# Patient Record
Sex: Male | Born: 1945 | Race: White | Hispanic: No | Marital: Married | Smoking: Never smoker
Health system: Southern US, Community
[De-identification: ages and names within clinical notes are randomized; demographics above are authoritative.]

## PROBLEM LIST (undated history)

## (undated) DIAGNOSIS — K760 Fatty (change of) liver, not elsewhere classified: Secondary | ICD-10-CM

## (undated) DIAGNOSIS — M1712 Unilateral primary osteoarthritis, left knee: Secondary | ICD-10-CM

## (undated) DIAGNOSIS — K649 Unspecified hemorrhoids: Secondary | ICD-10-CM

## (undated) DIAGNOSIS — I10 Essential (primary) hypertension: Secondary | ICD-10-CM

## (undated) DIAGNOSIS — K5790 Diverticulosis of intestine, part unspecified, without perforation or abscess without bleeding: Secondary | ICD-10-CM

## (undated) DIAGNOSIS — K219 Gastro-esophageal reflux disease without esophagitis: Secondary | ICD-10-CM

## (undated) DIAGNOSIS — K8020 Calculus of gallbladder without cholecystitis without obstruction: Secondary | ICD-10-CM

## (undated) DIAGNOSIS — I5189 Other ill-defined heart diseases: Secondary | ICD-10-CM

## (undated) DIAGNOSIS — Z8673 Personal history of transient ischemic attack (TIA), and cerebral infarction without residual deficits: Secondary | ICD-10-CM

## (undated) DIAGNOSIS — E785 Hyperlipidemia, unspecified: Secondary | ICD-10-CM

## (undated) DIAGNOSIS — Z87898 Personal history of other specified conditions: Secondary | ICD-10-CM

## (undated) DIAGNOSIS — Z860101 Personal history of adenomatous and serrated colon polyps: Secondary | ICD-10-CM

## (undated) DIAGNOSIS — K449 Diaphragmatic hernia without obstruction or gangrene: Secondary | ICD-10-CM

## (undated) HISTORY — DX: Gastro-esophageal reflux disease without esophagitis: K21.9

## (undated) HISTORY — PX: COLONOSCOPY: SHX174

## (undated) HISTORY — DX: Personal history of adenomatous and serrated colon polyps: Z86.0101

## (undated) HISTORY — DX: Other ill-defined heart diseases: I51.89

## (undated) HISTORY — DX: Hyperlipidemia, unspecified: E78.5

## (undated) HISTORY — DX: Unspecified hemorrhoids: K64.9

## (undated) HISTORY — DX: Diaphragmatic hernia without obstruction or gangrene: K44.9

## (undated) HISTORY — DX: Calculus of gallbladder without cholecystitis without obstruction: K80.20

## (undated) HISTORY — DX: Diverticulosis of intestine, part unspecified, without perforation or abscess without bleeding: K57.90

## (undated) HISTORY — PX: FRACTURE SURGERY: SHX138

## (undated) HISTORY — PX: BREAST SURGERY: SHX581

## (undated) HISTORY — DX: Personal history of transient ischemic attack (TIA), and cerebral infarction without residual deficits: Z86.73

## (undated) HISTORY — DX: Unilateral primary osteoarthritis, left knee: M17.12

## (undated) HISTORY — DX: Fatty (change of) liver, not elsewhere classified: K76.0

## (undated) HISTORY — DX: Essential (primary) hypertension: I10

## (undated) HISTORY — DX: Personal history of other specified conditions: Z87.898

---

## 2012-02-20 HISTORY — PX: APPENDECTOMY: SHX54

## 2012-11-05 ENCOUNTER — Ambulatory Visit (INDEPENDENT_AMBULATORY_CARE_PROVIDER_SITE_OTHER): Payer: Medicare Other | Admitting: Cardiovascular Disease

## 2012-11-05 ENCOUNTER — Encounter: Payer: Self-pay | Admitting: Cardiovascular Disease

## 2012-11-05 VITALS — BP 140/90 | HR 60 | Ht 68.0 in | Wt 182.0 lb

## 2012-11-05 DIAGNOSIS — R079 Chest pain, unspecified: Secondary | ICD-10-CM

## 2012-11-05 NOTE — Patient Instructions (Addendum)
Your physician has requested that you have an exercise stress myoview. For further information please visit [email or web address] Please follow instruction sheet, as given.  Your physician recommends that you continue on your current medications as directed. Please refer to the Current Medication list given to you today.  Your physician recommends that you schedule a follow-up appointment as needed.

## 2012-11-11 ENCOUNTER — Encounter: Payer: Self-pay | Admitting: Cardiovascular Disease

## 2012-11-11 DIAGNOSIS — R079 Chest pain, unspecified: Secondary | ICD-10-CM | POA: Insufficient documentation

## 2012-11-11 NOTE — Progress Notes (Signed)
HPI:  This is a 67 year old gentleman presenting for initial cardiac evaluation. The patient has no personal history of heart disease. Over the last 3-4 weeks, he has developed tightness and burning in his chest. This occurs at rest and with bending forward. There is associated nausea and exertional dyspnea. However, he is able to walk 2 miles in 30 minutes. He denies chest pain or pressure with that level of exertion. The patient has had acid reflux in the past, but his current symptoms feel different from that. He has started taking omeprazole without much improvement. He denies palpitations, lightheadedness, orthopnea, PND, or edema. The patient's chest pain is nonradiating and there are no other associated symptoms.  His cardiac risk factors include only hypertension. Other past medical problems include gastroesophageal reflux disease.  The patient is retired from [employer]. He is a lifelong nonsmoker and does not drink alcohol.  His family history is pertinent for longevity. His father died at age 66 of congestive heart failure. His mother is still alive at age 66. His father did have coronary bypass surgery in his 66s. He has a brother with hyperlipidemia and a sister with hypertension.  Outpatient Encounter Prescriptions as of 11/05/2012  Medication Sig Dispense Refill  . Inulin (METAMUCIL CLEAR & NATURAL) POWD Take 1 scoop by mouth daily. Take one scoop daily      . KRILL OIL PO Take by mouth daily.      Marland Kitchen lisinopril-hydrochlorothiazide (PRINZIDE,ZESTORETIC) 10-12.5 MG per tablet Take 1 tablet by mouth daily.      . Omeprazole 20 MG TBEC Take 20 mg by mouth daily.      . [DISCONTINUED] l-methylfolate-B6-B12 (METANX) 3-35-2 MG TABS Take 1 tablet by mouth daily.       No facility-administered encounter medications on file as of 11/05/2012.    Tape  Past Medical History  Diagnosis Date  . Hypertension   . Hemorrhoids   . Gastroesophageal reflux disease     Past Surgical  History  Procedure Laterality Date  . Appendectomy  2014    History   Social History  . Marital Status: Married    Spouse Name: N/A    Number of Children: N/A  . Years of Education: N/A   Occupational History  . Not on file.   Social History Main Topics  . Smoking status: Never Smoker   . Smokeless tobacco: Not on file  . Alcohol Use: Not on file  . Drug Use: Not on file  . Sexual Activity: Not on file   Other Topics Concern  . Not on file   Social History Narrative   The patient is retired. He is married. He does not smoke cigarettes or drink alcohol.   ROS:  General: no fevers/chills/night sweats Eyes: no blurry vision, diplopia, or amaurosis ENT: no sore throat or hearing loss Resp: no cough, wheezing, or hemoptysis CV: no edema or palpitations GI: no abdominal pain, nausea, vomiting, diarrhea, or constipation GU: no dysuria, frequency, or hematuria Skin: no rash Neuro: no headache, numbness, tingling, or weakness of extremities Musculoskeletal: no joint pain or swelling Heme: no bleeding, DVT, or easy bruising Endo: no polydipsia or polyuria  BP 140/90  Pulse 60  Ht 5\' 8"  (1.727 m)  Wt 182 lb (82.555 kg)  BMI 27.68 kg/m2  PHYSICAL EXAM: Pt is alert and oriented, WD, WN, in no distress. HEENT: normal Neck: JVP normal. Carotid upstrokes normal without bruits. No thyromegaly. Lungs: equal expansion, clear bilaterally CV: Apex is discrete  and nondisplaced, RRR without murmur or gallop Abd: soft, NT, +BS, no bruit, no hepatosplenomegaly Back: no CVA tenderness Ext: no C/C/E        DP/PT pulses intact and = Skin: warm and dry without rash Neuro: CNII-XII intact             Strength intact = bilaterally  EKG:  Normal sinus rhythm 60 beats per minute, within normal limits.  ASSESSMENT AND PLAN: This is a 67 year old gentleman with chest pain. The patient's chest pain syndrome has both typical and atypical features. Background cardiac risk factors include  hypertension. His father did have coronary bypass surgery in his 66s. I have recommended an exercise stress Myoview for further ischemic evaluation. If this study is negative, would recommend further evaluation for GI etiologies of chest discomfort such as acid reflux. Plans for evaluation were discussed with the patient who is agreeable to proceed. further workup as indicated pending his stress test results.  For his hypertension, he will continue on lisinopril and hydrochlorothiazide. He reports home blood pressure readings have been in a good range.  As long as his stress test is low risk, I will plan on seeing him back as needed.  Tonny Bollman 11/11/2012 6:09 AM

## 2012-11-18 ENCOUNTER — Ambulatory Visit (HOSPITAL_COMMUNITY): Payer: Medicare Other | Attending: Cardiovascular Disease | Admitting: Radiology

## 2012-11-18 VITALS — BP 165/90 | HR 79 | Ht 68.0 in | Wt 179.0 lb

## 2012-11-18 DIAGNOSIS — R0602 Shortness of breath: Secondary | ICD-10-CM

## 2012-11-18 DIAGNOSIS — R0609 Other forms of dyspnea: Secondary | ICD-10-CM | POA: Insufficient documentation

## 2012-11-18 DIAGNOSIS — I10 Essential (primary) hypertension: Secondary | ICD-10-CM | POA: Insufficient documentation

## 2012-11-18 DIAGNOSIS — I4949 Other premature depolarization: Secondary | ICD-10-CM

## 2012-11-18 DIAGNOSIS — R079 Chest pain, unspecified: Secondary | ICD-10-CM

## 2012-11-18 DIAGNOSIS — R0789 Other chest pain: Secondary | ICD-10-CM | POA: Insufficient documentation

## 2012-11-18 DIAGNOSIS — R110 Nausea: Secondary | ICD-10-CM | POA: Insufficient documentation

## 2012-11-18 DIAGNOSIS — Z8249 Family history of ischemic heart disease and other diseases of the circulatory system: Secondary | ICD-10-CM | POA: Insufficient documentation

## 2012-11-18 DIAGNOSIS — R0989 Other specified symptoms and signs involving the circulatory and respiratory systems: Secondary | ICD-10-CM | POA: Insufficient documentation

## 2012-11-18 MED ORDER — TECHNETIUM TC 99M SESTAMIBI GENERIC - CARDIOLITE
10.0000 | Freq: Once | INTRAVENOUS | Status: AC | PRN
  Administered 2012-11-18: 10 via INTRAVENOUS

## 2012-11-18 MED ORDER — TECHNETIUM TC 99M SESTAMIBI GENERIC - CARDIOLITE
30.0000 | Freq: Once | INTRAVENOUS | Status: AC | PRN
  Administered 2012-11-18: 30 via INTRAVENOUS

## 2012-11-18 NOTE — Progress Notes (Signed)
[facility] SITE 3 NUCLEAR MED [address] [place], [place] [address] [phone]    Cardiology Nuclear Med Study  Christopher Francis is a 67 y.o. male     MRN : [id number]     DOB: 08-Mar-1945  Procedure Date: 11/18/2012  Nuclear Med Background Indication for Stress Test:  Evaluation for Ischemia History:  ~9 yrs ago [id number] Cardiac Risk Factors: Family History - CAD and Hypertension  Symptoms:  Chest Tightness/Burning (last episode of chest discomfort was about a week ago), DOE and Nausea   Nuclear Pre-Procedure Caffeine/Decaff Intake:  8:00pm NPO After: 8:00pm   Lungs:  Clear. O2 Sat: 98% on room air. IV 0.9% NS with Angio Cath:  20g  IV Site: R Hand  IV Started by:  Cathlyn Parsons, RN  Chest Size (in):  42 Cup Size: n/a  Height: 5\' 8"  (1.727 m)  Weight:  179 lb (81.194 kg)  BMI:  Body mass index is 27.22 kg/(m^2). Tech Comments:  med's taken as directed    Nuclear Med Study 1 or 2 day study: 1 day  Stress Test Type:  Stress  Reading MD: Cassell Clement, MD  Order Authorizing Provider:  Tonny Bollman, MD  Resting Radionuclide: Technetium 49m Sestamibi  Resting Radionuclide Dose: 11.0 mCi   Stress Radionuclide:  Technetium 4m Sestamibi  Stress Radionuclide Dose: 33.0 mCi           Stress Protocol Rest HR: 79 Stress HR: 144  Rest BP: 165/90 Stress BP: 220/120  Exercise Time (min): 4:41 METS: 5.6   Predicted Max HR: 154 bpm % Max HR: 93.51 bpm Rate Pressure Product: 27253   Dose of Adenosine (mg):  n/a Dose of Lexiscan: n/a mg  Dose of Atropine (mg): n/a Dose of Dobutamine: n/a mcg/kg/min (at max HR)  Stress Test Technologist: Smiley Houseman, CMA-N  Nuclear Technologist:  Harlow Asa, CNMT     Rest Procedure:  Myocardial perfusion imaging was performed at rest 45 minutes following the intravenous administration of Technetium 32m Sestamibi.  Rest ECG: NSR - Normal EKG  Stress Procedure:  The patient exercised on the treadmill utilizing the  Bruce Protocol for 4:41 minutes. The patient stopped due to fatigue and hypertensive response.  He denied any chest pain.  There were occasional PVC's noted with exercise.  Technetium 38m Sestamibi was injected at peak exercise and myocardial perfusion imaging was performed after a brief delay.  Stress ECG: Insignificant upsloping ST segment depression.  QPS Raw Data Images:  Normal; no motion artifact; normal heart/lung ratio. Stress Images:  Normal homogeneous uptake in all areas of the myocardium. Rest Images:  Normal homogeneous uptake in all areas of the myocardium. Subtraction (SDS):  No evidence of ischemia. Transient Ischemic Dilatation (Normal <1.22):  n/a Lung/Heart Ratio (Normal <0.45):  0.38  Quantitative Gated Spect Images QGS EDV:  92 ml QGS ESV:  32 ml  Impression Exercise Capacity:  Fair exercise capacity. BP Response:  Hypertensive blood pressure response. Clinical Symptoms:  There is dyspnea. ECG Impression:  Insignificant upsloping ST segment depression. Comparison with Prior Nuclear Study: No images to compare  Overall Impression:  Normal stress nuclear study.  LV Ejection Fraction: 65%.  LV Wall Motion:  NL LV Function; NL Wall Motion  [employer]  .

## 2015-03-24 ENCOUNTER — Ambulatory Visit (INDEPENDENT_AMBULATORY_CARE_PROVIDER_SITE_OTHER): Payer: Medicare Other | Admitting: Cardiovascular Disease

## 2015-03-24 ENCOUNTER — Encounter: Payer: Self-pay | Admitting: Cardiovascular Disease

## 2015-03-24 VITALS — BP 140/100 | HR 72 | Ht 68.0 in | Wt 186.0 lb

## 2015-03-24 DIAGNOSIS — R55 Syncope and collapse: Secondary | ICD-10-CM

## 2015-03-24 LAB — CBC
HEMATOCRIT: 43.8 % (ref 39.0–52.0)
HEMOGLOBIN: 14.6 g/dL (ref 13.0–17.0)
MCH: 27.7 pg (ref 26.0–34.0)
MCHC: 33.3 g/dL (ref 30.0–36.0)
MCV: 83 fL (ref 78.0–100.0)
MPV: 9.7 fL (ref 8.6–12.4)
Platelets: 241 10*3/uL (ref 150–400)
RBC: 5.28 MIL/uL (ref 4.22–5.81)
RDW: 14 % (ref 11.5–15.5)
WBC: 6.4 10*3/uL (ref 4.0–10.5)
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
## 2015-03-24 NOTE — Patient Instructions (Signed)
Medication Instructions:  Your physician recommends that you continue on your current medications as directed. Please refer to the Current Medication list given to you today.  Labwork: Your physician recommends that you have lab work today: CBC and CMP  Testing/Procedures: Your physician has requested that you have an echocardiogram. Echocardiography is a painless test that uses sound waves to create images of your heart. It provides your doctor with information about the size and shape of your heart and how well your heart's chambers and valves are working. This procedure takes approximately one hour. There are no restrictions for this procedure.  Your physician has recommended that you wear an event monitor (21 day). Event monitors are medical devices that record the heart's electrical activity. Doctors most often [place] these monitors to diagnose arrhythmias. Arrhythmias are problems with the speed or rhythm of the heartbeat. The monitor is a small, portable device. You can wear one while you do your normal daily activities. This is usually used to diagnose what is causing palpitations/syncope (passing out).  Follow-Up: Your physician wants you to follow-up in: 6 MONTHS with Dr Burt Knack.  You will receive a reminder letter in the mail two months in advance. If you don't receive a letter, please call our office to schedule the follow-up appointment.   Any Other Special Instructions Will Be Listed Below (If Applicable).     If you need a refill on your cardiac medications before your next appointment, please call your pharmacy.

## 2015-03-25 ENCOUNTER — Other Ambulatory Visit: Payer: Self-pay

## 2015-03-25 DIAGNOSIS — R55 Syncope and collapse: Secondary | ICD-10-CM

## 2015-03-25 LAB — COMPREHENSIVE METABOLIC PANEL
ALBUMIN: 4.4 g/dL (ref 3.6–5.1)
ALT: 19 U/L (ref 9–46)
AST: 29 U/L (ref 10–35)
Alkaline Phosphatase: 53 U/L (ref 40–115)
BILIRUBIN TOTAL: 0.7 mg/dL (ref 0.2–1.2)
BUN: 34 mg/dL — ABNORMAL HIGH (ref 7–25)
CHLORIDE: 98 mmol/L (ref 98–110)
CO2: 23 mmol/L (ref 20–31)
CREATININE: 1.27 mg/dL — AB (ref 0.70–1.25)
Calcium: 9.5 mg/dL (ref 8.6–10.3)
Glucose, Bld: 56 mg/dL — ABNORMAL LOW (ref 65–99)
Potassium: 4.8 mmol/L (ref 3.5–5.3)
SODIUM: 135 mmol/L (ref 135–146)
TOTAL PROTEIN: 7.1 g/dL (ref 6.1–8.1)

## 2015-03-28 ENCOUNTER — Ambulatory Visit (HOSPITAL_COMMUNITY): Payer: Medicare Other | Attending: Cardiovascular Disease

## 2015-03-28 ENCOUNTER — Other Ambulatory Visit: Payer: Self-pay

## 2015-03-28 DIAGNOSIS — I10 Essential (primary) hypertension: Secondary | ICD-10-CM | POA: Diagnosis not present

## 2015-03-28 DIAGNOSIS — R55 Syncope and collapse: Secondary | ICD-10-CM | POA: Insufficient documentation

## 2015-03-28 DIAGNOSIS — I517 Cardiomegaly: Secondary | ICD-10-CM | POA: Insufficient documentation

## 2015-03-29 ENCOUNTER — Ambulatory Visit (INDEPENDENT_AMBULATORY_CARE_PROVIDER_SITE_OTHER): Payer: Medicare Other

## 2015-03-29 DIAGNOSIS — R55 Syncope and collapse: Secondary | ICD-10-CM | POA: Diagnosis not present

## 2015-04-29 ENCOUNTER — Encounter: Payer: Self-pay | Admitting: Cardiovascular Disease

## 2015-04-29 NOTE — Telephone Encounter (Signed)
This encounter was created in error - please disregard.

## 2015-04-29 NOTE — Telephone Encounter (Signed)
Returning your call. °

## 2015-05-09 ENCOUNTER — Other Ambulatory Visit (INDEPENDENT_AMBULATORY_CARE_PROVIDER_SITE_OTHER): Payer: Medicare Other | Admitting: *Deleted

## 2015-05-09 DIAGNOSIS — R55 Syncope and collapse: Secondary | ICD-10-CM

## 2015-05-09 LAB — BASIC METABOLIC PANEL
BUN: 20 mg/dL (ref 7–25)
CALCIUM: 9 mg/dL (ref 8.6–10.3)
CHLORIDE: 103 mmol/L (ref 98–110)
CO2: 26 mmol/L (ref 20–31)
Creat: 1.13 mg/dL (ref 0.70–1.25)
Glucose, Bld: 98 mg/dL (ref 65–99)
Potassium: 4.3 mmol/L (ref 3.5–5.3)
Sodium: 138 mmol/L (ref 135–146)

## 2015-05-12 ENCOUNTER — Telehealth: Payer: Self-pay | Admitting: Cardiovascular Disease

## 2015-05-12 NOTE — Telephone Encounter (Signed)
°  Follow Up   Pt calling regarding test results. Please call.

## 2015-05-12 NOTE — Telephone Encounter (Signed)
Left a message for the pt to call back to endorse lab results per Dr Burt Knack.

## 2015-05-13 NOTE — Telephone Encounter (Signed)
I spoke with the pt and made him aware of BMP results.

## 2015-05-13 NOTE — Telephone Encounter (Signed)
F./u  Pt returning RN phone call- test results. Please call back and discuss.

## 2015-09-20 DEATH — deceased

## 2016-07-02 ENCOUNTER — Other Ambulatory Visit: Payer: Self-pay

## 2016-07-02 ENCOUNTER — Encounter: Payer: Self-pay | Admitting: Cardiovascular Disease

## 2016-07-02 DIAGNOSIS — R109 Unspecified abdominal pain: Secondary | ICD-10-CM

## 2016-07-02 DIAGNOSIS — R55 Syncope and collapse: Secondary | ICD-10-CM

## 2016-07-02 NOTE — Progress Notes (Signed)
Spoke to the patient's daughter today who I know from work in the cardiac catheterization lab. The patient has continued to have recurrent episodes of syncope now generally following abdominal pain. He has undergone evaluation with endoscopy and colonoscopy. I evaluated him in 2017 for syncope and found no clear cause. After discussion of his symptoms, I recommend proceeding with an abdominal ultrasound to evaluate the aorta and rule out aortic aneurysm. I reviewed his records through care everywhere and he had a CT scan of the abdomen in 2014 which demonstrated cholelithiasis. There were no comments on that study about the aorta. If his abdominal duplex is negative would recommend formal GI consultation for further testing.  Sherren Mocha 07/02/2016 10:56 AM

## 2016-07-02 NOTE — Progress Notes (Signed)
Pt scheduled for AAA duplex on 07/04/16. Daphane Shepherd has already notified pt of appt.

## 2016-07-04 ENCOUNTER — Other Ambulatory Visit (HOSPITAL_COMMUNITY): Payer: Medicare Other

## 2021-03-22 DIAGNOSIS — Z86718 Personal history of other venous thrombosis and embolism: Secondary | ICD-10-CM

## 2021-03-22 DIAGNOSIS — Z86711 Personal history of pulmonary embolism: Secondary | ICD-10-CM

## 2021-03-22 HISTORY — DX: Personal history of pulmonary embolism: Z86.711

## 2021-03-22 HISTORY — DX: Personal history of other venous thrombosis and embolism: Z86.718

## 2021-04-10 ENCOUNTER — Emergency Department (HOSPITAL_COMMUNITY): Payer: Medicare (Managed Care)

## 2021-04-10 ENCOUNTER — Inpatient Hospital Stay (HOSPITAL_COMMUNITY)
Admission: EM | Admit: 2021-04-10 | Discharge: 2021-04-15 | DRG: 100 | Disposition: A | Discharge status: Home / Self Care | Payer: Medicare (Managed Care) | Attending: Internal Medicine | Admitting: Internal Medicine

## 2021-04-10 ENCOUNTER — Encounter (HOSPITAL_COMMUNITY): Payer: Self-pay | Admitting: Emergency Medicine

## 2021-04-10 DIAGNOSIS — K529 Noninfective gastroenteritis and colitis, unspecified: Secondary | ICD-10-CM | POA: Diagnosis present

## 2021-04-10 DIAGNOSIS — R571 Hypovolemic shock: Secondary | ICD-10-CM | POA: Diagnosis not present

## 2021-04-10 DIAGNOSIS — E785 Hyperlipidemia, unspecified: Secondary | ICD-10-CM | POA: Diagnosis present

## 2021-04-10 DIAGNOSIS — Z0189 Encounter for other specified special examinations: Secondary | ICD-10-CM

## 2021-04-10 DIAGNOSIS — Z8249 Family history of ischemic heart disease and other diseases of the circulatory system: Secondary | ICD-10-CM

## 2021-04-10 DIAGNOSIS — I959 Hypotension, unspecified: Secondary | ICD-10-CM | POA: Diagnosis not present

## 2021-04-10 DIAGNOSIS — N401 Enlarged prostate with lower urinary tract symptoms: Secondary | ICD-10-CM | POA: Diagnosis present

## 2021-04-10 DIAGNOSIS — E872 Acidosis, unspecified: Secondary | ICD-10-CM | POA: Diagnosis not present

## 2021-04-10 DIAGNOSIS — R0902 Hypoxemia: Secondary | ICD-10-CM

## 2021-04-10 DIAGNOSIS — I493 Ventricular premature depolarization: Secondary | ICD-10-CM | POA: Diagnosis not present

## 2021-04-10 DIAGNOSIS — R569 Unspecified convulsions: Secondary | ICD-10-CM | POA: Diagnosis not present

## 2021-04-10 DIAGNOSIS — R7303 Prediabetes: Secondary | ICD-10-CM | POA: Diagnosis present

## 2021-04-10 DIAGNOSIS — N179 Acute kidney failure, unspecified: Secondary | ICD-10-CM | POA: Diagnosis present

## 2021-04-10 DIAGNOSIS — J690 Pneumonitis due to inhalation of food and vomit: Secondary | ICD-10-CM | POA: Diagnosis present

## 2021-04-10 DIAGNOSIS — J9811 Atelectasis: Secondary | ICD-10-CM | POA: Diagnosis not present

## 2021-04-10 DIAGNOSIS — G9341 Metabolic encephalopathy: Secondary | ICD-10-CM | POA: Diagnosis present

## 2021-04-10 DIAGNOSIS — I10 Essential (primary) hypertension: Secondary | ICD-10-CM | POA: Diagnosis present

## 2021-04-10 DIAGNOSIS — Z91048 Other nonmedicinal substance allergy status: Secondary | ICD-10-CM

## 2021-04-10 DIAGNOSIS — Z79899 Other long term (current) drug therapy: Secondary | ICD-10-CM

## 2021-04-10 DIAGNOSIS — R338 Other retention of urine: Secondary | ICD-10-CM | POA: Diagnosis not present

## 2021-04-10 DIAGNOSIS — E860 Dehydration: Secondary | ICD-10-CM | POA: Diagnosis present

## 2021-04-10 DIAGNOSIS — Z8673 Personal history of transient ischemic attack (TIA), and cerebral infarction without residual deficits: Secondary | ICD-10-CM

## 2021-04-10 DIAGNOSIS — I2699 Other pulmonary embolism without acute cor pulmonale: Secondary | ICD-10-CM | POA: Diagnosis not present

## 2021-04-10 DIAGNOSIS — Z7901 Long term (current) use of anticoagulants: Secondary | ICD-10-CM

## 2021-04-10 DIAGNOSIS — J9601 Acute respiratory failure with hypoxia: Secondary | ICD-10-CM | POA: Diagnosis present

## 2021-04-10 DIAGNOSIS — K219 Gastro-esophageal reflux disease without esophagitis: Secondary | ICD-10-CM | POA: Diagnosis present

## 2021-04-10 DIAGNOSIS — Z20822 Contact with and (suspected) exposure to covid-19: Secondary | ICD-10-CM | POA: Diagnosis present

## 2021-04-10 DIAGNOSIS — K5900 Constipation, unspecified: Secondary | ICD-10-CM | POA: Diagnosis not present

## 2021-04-10 LAB — I-STAT ARTERIAL BLOOD GAS, ED
Acid-base deficit: 8 mmol/L — ABNORMAL HIGH (ref 0.0–2.0)
Bicarbonate: 19.3 mmol/L — ABNORMAL LOW (ref 20.0–28.0)
Calcium, Ion: 1.2 mmol/L (ref 1.15–1.40)
HCT: 39 % (ref 39.0–52.0)
Hemoglobin: 13.3 g/dL (ref 13.0–17.0)
O2 Saturation: 83 %
Patient temperature: 96.8
Potassium: 4.5 mmol/L (ref 3.5–5.1)
Sodium: 134 mmol/L — ABNORMAL LOW (ref 135–145)
TCO2: 21 mmol/L — ABNORMAL LOW (ref 22–32)
pCO2 arterial: 42.5 mmHg (ref 32–48)
pH, Arterial: 7.26 — ABNORMAL LOW (ref 7.35–7.45)
pO2, Arterial: 52 mmHg — ABNORMAL LOW (ref 83–108)

## 2021-04-10 LAB — COMPREHENSIVE METABOLIC PANEL
ALT: 18 U/L (ref 0–44)
AST: 22 U/L (ref 15–41)
Albumin: 3.8 g/dL (ref 3.5–5.0)
Alkaline Phosphatase: 65 U/L (ref 38–126)
Anion gap: 15 (ref 5–15)
BUN: 21 mg/dL (ref 8–23)
CO2: 17 mmol/L — ABNORMAL LOW (ref 22–32)
Calcium: 9 mg/dL (ref 8.9–10.3)
Chloride: 103 mmol/L (ref 98–111)
Creatinine, Ser: 1.76 mg/dL — ABNORMAL HIGH (ref 0.61–1.24)
GFR, Estimated: 40 mL/min — ABNORMAL LOW (ref >60)
Glucose, Bld: 254 mg/dL — ABNORMAL HIGH (ref 70–99)
Potassium: 4.4 mmol/L (ref 3.5–5.1)
Sodium: 135 mmol/L (ref 135–145)
Total Bilirubin: 0.6 mg/dL (ref 0.3–1.2)
Total Protein: 6.4 g/dL — ABNORMAL LOW (ref 6.5–8.1)

## 2021-04-10 LAB — CBC WITH DIFFERENTIAL/PLATELET
Abs Immature Granulocytes: 0.14 10*3/uL — ABNORMAL HIGH (ref 0.00–0.07)
Basophils Absolute: 0 10*3/uL (ref 0.0–0.1)
Basophils Relative: 0 %
Eosinophils Absolute: 0 10*3/uL (ref 0.0–0.5)
Eosinophils Relative: 0 %
HCT: 49.2 % (ref 39.0–52.0)
Hemoglobin: 15.5 g/dL (ref 13.0–17.0)
Immature Granulocytes: 1 %
Lymphocytes Relative: 10 %
Lymphs Abs: 1.7 10*3/uL (ref 0.7–4.0)
MCH: 27.2 pg (ref 26.0–34.0)
MCHC: 31.5 g/dL (ref 30.0–36.0)
MCV: 86.3 fL (ref 80.0–100.0)
Monocytes Absolute: 0.6 10*3/uL (ref 0.1–1.0)
Monocytes Relative: 4 %
Neutro Abs: 14.5 10*3/uL — ABNORMAL HIGH (ref 1.7–7.7)
Neutrophils Relative %: 85 %
Platelets: 292 10*3/uL (ref 150–400)
RBC: 5.7 MIL/uL (ref 4.22–5.81)
RDW: 13.9 % (ref 11.5–15.5)
WBC: 17 10*3/uL — ABNORMAL HIGH (ref 4.0–10.5)
nRBC: 0 % (ref 0.0–0.2)

## 2021-04-10 LAB — RESP PANEL BY RT-PCR (FLU A&B, COVID) ARPGX2
Influenza A by PCR: NEGATIVE
Influenza B by PCR: NEGATIVE
SARS Coronavirus 2 by RT PCR: NEGATIVE

## 2021-04-10 LAB — ETHANOL: Alcohol, Ethyl (B): <10 mg/dL (ref <10)

## 2021-04-10 LAB — PROTIME-INR
INR: 1.1 (ref 0.8–1.2)
Prothrombin Time: 14.5 seconds (ref 11.4–15.2)

## 2021-04-10 LAB — ACETAMINOPHEN LEVEL: Acetaminophen (Tylenol), Serum: 10 ug/mL (ref 10–30)

## 2021-04-10 LAB — I-STAT CHEM 8, ED
BUN: 22 mg/dL (ref 8–23)
Calcium, Ion: 1.09 mmol/L — ABNORMAL LOW (ref 1.15–1.40)
Chloride: 106 mmol/L (ref 98–111)
Creatinine, Ser: 1.8 mg/dL — ABNORMAL HIGH (ref 0.61–1.24)
Glucose, Bld: 256 mg/dL — ABNORMAL HIGH (ref 70–99)
HCT: 50 % (ref 39.0–52.0)
Hemoglobin: 17 g/dL (ref 13.0–17.0)
Potassium: 4.2 mmol/L (ref 3.5–5.1)
Sodium: 137 mmol/L (ref 135–145)
TCO2: 19 mmol/L — ABNORMAL LOW (ref 22–32)

## 2021-04-10 LAB — TROPONIN I (HIGH SENSITIVITY): Troponin I (High Sensitivity): 13 ng/L (ref <18)

## 2021-04-10 LAB — TYPE AND SCREEN
ABO/RH(D): A POS
Antibody Screen: NEGATIVE

## 2021-04-10 LAB — ABO/RH: ABO/RH(D): A POS

## 2021-04-10 LAB — SALICYLATE LEVEL: Salicylate Lvl: <7 mg/dL — ABNORMAL LOW (ref 7.0–30.0)

## 2021-04-10 LAB — AMMONIA: Ammonia: 26 umol/L (ref 9–35)

## 2021-04-10 LAB — LACTIC ACID, PLASMA: Lactic Acid, Venous: 4.7 mmol/L (ref 0.5–1.9)

## 2021-04-10 MED ORDER — FENTANYL CITRATE (PF) 100 MCG/2ML IJ SOLN
INTRAMUSCULAR | Status: AC
  Filled 2021-04-10: qty 2

## 2021-04-10 MED ORDER — IOHEXOL 350 MG/ML SOLN
100.0000 mL | Freq: Once | INTRAVENOUS | Status: AC | PRN
  Administered 2021-04-10: 100 mL via INTRAVENOUS

## 2021-04-10 MED ORDER — SODIUM CHLORIDE 0.9 % IV SOLN
2000.0000 mg | Freq: Once | INTRAVENOUS | Status: DC
Start: 2021-04-10 — End: 2021-04-11
  Filled 2021-04-10: qty 20

## 2021-04-10 MED ORDER — ROCURONIUM BROMIDE 50 MG/5ML IV SOLN
100.0000 mg | Freq: Once | INTRAVENOUS | Status: AC
  Administered 2021-04-10: 100 mg via INTRAVENOUS

## 2021-04-10 MED ORDER — PANTOPRAZOLE 80MG IVPB - SIMPLE MED
80.0000 mg | Freq: Once | INTRAVENOUS | Status: AC
Start: 2021-04-10 — End: 2021-04-11
  Administered 2021-04-10: 80 mg via INTRAVENOUS
  Filled 2021-04-10: qty 80

## 2021-04-10 MED ORDER — FENTANYL CITRATE PF 50 MCG/ML IJ SOSY
100.0000 ug | PREFILLED_SYRINGE | Freq: Once | INTRAMUSCULAR | Status: AC
  Administered 2021-04-10: 100 ug via INTRAVENOUS

## 2021-04-10 MED ORDER — PROPOFOL 1000 MG/100ML IV EMUL
0.0000 ug/kg/min | INTRAVENOUS | Status: DC
  Administered 2021-04-11: 5 ug/kg/min via INTRAVENOUS
  Administered 2021-04-11 – 2021-04-12 (×2): 20 ug/kg/min via INTRAVENOUS
  Filled 2021-04-10 (×4): qty 100

## 2021-04-10 MED ORDER — PANTOPRAZOLE SODIUM 40 MG IV SOLR
40.0000 mg | Freq: Two times a day (BID) | INTRAVENOUS | Status: DC
Start: 2021-04-14 — End: 2021-04-13

## 2021-04-10 MED ORDER — LEVETIRACETAM IN NACL 1000 MG/100ML IV SOLN
1000.0000 mg | Freq: Once | INTRAVENOUS | Status: AC
  Administered 2021-04-10: 1000 mg via INTRAVENOUS

## 2021-04-10 MED ORDER — FENTANYL 2500MCG IN NS 250ML (10MCG/ML) PREMIX INFUSION
25.0000 ug/h | INTRAVENOUS | Status: DC
  Administered 2021-04-10: 50 ug/h via INTRAVENOUS
  Administered 2021-04-11 – 2021-04-12 (×2): 175 ug/h via INTRAVENOUS
  Filled 2021-04-10 (×3): qty 250

## 2021-04-10 MED ORDER — FENTANYL BOLUS VIA INFUSION
25.0000 ug | INTRAVENOUS | Status: DC | PRN
  Filled 2021-04-10: qty 25

## 2021-04-10 MED ORDER — PANTOPRAZOLE INFUSION (NEW) - SIMPLE MED
8.0000 mg/h | INTRAVENOUS | Status: DC
  Administered 2021-04-10 – 2021-04-13 (×7): 8 mg/h via INTRAVENOUS
  Filled 2021-04-10: qty 100
  Filled 2021-04-10 (×3): qty 80
  Filled 2021-04-10: qty 100
  Filled 2021-04-10 (×3): qty 80
  Filled 2021-04-10: qty 100

## 2021-04-10 MED ORDER — ETOMIDATE 2 MG/ML IV SOLN
20.0000 mg | Freq: Once | INTRAVENOUS | Status: AC
  Administered 2021-04-10: 20 mg via INTRAVENOUS

## 2021-04-10 NOTE — ED Triage Notes (Signed)
Patient arrived with EMS from home found unresponsive by wife in the toilet , EMS gave 2 doses of Versed 2.5 mg IV for seizures x2 prior to arrival , intubated by EDR at arrival .

## 2021-04-10 NOTE — Progress Notes (Signed)
EEG complete - results pending 
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
## 2021-04-10 NOTE — ED Notes (Signed)
EEG in progress 

## 2021-04-10 NOTE — ED Notes (Signed)
Patient transported to CT scan . 

## 2021-04-10 NOTE — ED Provider Notes (Signed)
[facility] Provider Note  History   Chief Complaint  Patient presents with   Unresponsive / Seizures   The history is provided by the EMS personnel and the spouse.  Illness Location:  Unresponsive episode Quality:  See MDM for additional details Severity:  Severe Onset quality:  Sudden Duration: Occurred at 8:40 PM. Timing:  Constant Progression:  Unchanged Chronicity:  New Context:  See MDM   Past Medical History:  Diagnosis Date   Gastroesophageal reflux disease    Hemorrhoids    Hypertension     Social History   Tobacco Use   Smoking status: Never     Family History  Problem Relation Age of Onset   Heart disease Father     Review of Systems  Unable to perform ROS: Acuity of condition    Physical Exam   Today's Vitals   04/10/21 2221 04/10/21 2230  BP: (!) 155/82   Pulse: (!) 112 (!) 112  Resp: 20 19  Temp: (!) 96.8 F (36 C)   TempSrc: Temporal   SpO2: 100% 97%    Physical Exam:  General: Snoring respirations, not responding to verbal/tactile stimulation   Head: Normocephalic, atraumatic.  No skull depressions or lacerations.  No conjunctival hemorrhage No periorbital ecchymoses, [place] Eyes, or Battle Sign bilaterally Ears atraumatic No nasal septal deviation or hematoma  Bilateral pinpoint pupils. Mucus membranes moist.    Neck: Supple, trachea midline Unable to examine C-spine given acuity of situation Cervical hard collar in place   Cardiovascular: RATE: 112 RHYTHM: regular 2+ radial, femoral, DP pulses bilaterally   Respiratory/Chest Wall: Lungs with bilateral crackles diffusely Clavicles stable to compression Chest stable to AP and lateral compression, no crepitus   Extremities: Warm, well perfused. No gross deformities.    Gastrointestinal: Abdomen soft, nondistended FAST performed: No   Neurologic: Eyes do not open, no verbal sounds (other than snoring respirations), decerebrate  posturing in RUE   Genitourinary: Deferred   Skin: N/A   Glasgow Coma Scale: GCS 4   Rectal: Deferred   Spine: Deferred given acuity of presentation   Other:      ED Course    ED Course  Procedure Name: Intubation Date/Time: 04/10/2021 10:37 PM Performed by: Wynetta Fines, MD Pre-anesthesia Checklist: Patient identified, Emergency Drugs available, Patient being monitored, Timeout performed and Suction available Oxygen Delivery Method: Ambu bag Preoxygenation: Pre-oxygenation with 100% oxygen Induction Type: Rapid sequence Ventilation: Mask ventilation without difficulty Laryngoscope Size: Glidescope and 4 Grade View: Grade II Tube size: 7.5 mm Number of attempts: 1 Airway Equipment and Method: Video-laryngoscopy Placement Confirmation: ETT inserted through vocal cords under direct vision, Breath sounds checked- equal and bilateral, CO2 detector and Positive ETCO2 Secured at: 25 cm Tube secured with: ETT holder Future Recommendations: Recommend- induction with short-acting agent, and alternative techniques readily available    Medical Decision Making:  Christopher Francis is a 76 y.o. male w/ h/o HTN, HLD, GERD who p/w unresponsive with EMS.   Awoke this morning with normal mental status on presentation to the ED, patient with snoring respirations, GCS 4, Family reports the patient complained of generalized abdominal pain, nausea, diarrhea, headache.  Denied dizziness, chest pain, shortness of breath.  Had a few episodes of "brown" emesis.  Otherwise seemed normal.  Given his diarrheal illness, he was trying to quarantine away from his family today. This evening at 2040, the wife heard a "thud" and found the patient unresponsive beside the toilet.  EMS was subsequently called.  On EMS arrival, eyes pinpoint, administered 1 mg Narcan without resolution of unresponsiveness.  Placed left nasal trumpet. In route to the ED, patient had 2 seizures, total of Versed 5 mg given. No  history of seizures  On arrival to the ED: Patient with snoring respirations, normotension, tachycardia, GCS 4.  Intubated for airway protection and GCS <8.  Will obtain AMS labs, EKG, and CT imaging as below to evaluate for intracranial abnormality versus traumatic injury.  Of note, during intubation, coffee-ground emesis versus stomach contents noted in airway.  Once NG tube placed, large-volume coffee-ground emesis observed evacuating through NG tube.  No appreciable bright red blood per rectum.  Spoke with neurology, recommendations per their note.  They recommend critical care admission and they will order EEG.  ER provider interpretation of Imaging / Radiology:  CXR: ETT 2.6 superior to the carina.  No PTX. CT head: No acute intracranial abnormality CTA head/neck: Pending CT C-spine: No acute fracture or malalignment CT CAP dissection protocol: Pending CT T/L-spine: Pending  ER provider interpretation of EKG:  Atrial fibrillation (HR 112), no ST elevation or reciprocal changes, QTc 512  ER provider interpretation of Labs:  ABG pH 7.260, PCO2 42.5, bicarb 19.3, base deficit 8 CBC: WBC 17, Hgb 15.5 CMP: No emergent electrolyte abnormality, CO2 17, BG 254, BUN 21, Cr 1.76, no elevated AST/ALT, no elevated anion gap TSH pending Troponin: 13 Ammonia 26 Lactic acid 4.7 Coags unremarkable Acetaminophen level unremarkable Salicylate level unremarkable Ethanol level <10 COVID/flu negative UA pending UDS pending  Key medications administered in the ER:  Medications  fentaNYL 2592mcg in NS 231mL (65mcg/ml) infusion-PREMIX (has no administration in time range)  fentaNYL (SUBLIMAZE) bolus via infusion 25 mcg (has no administration in time range)  fentaNYL (SUBLIMAZE) injection 100 mcg (has no administration in time range)  propofol (DIPRIVAN) 1000 MG/100ML infusion (has no administration in time range)  fentaNYL (SUBLIMAZE) 100 MCG/2ML injection (has no administration in time range)   pantoprazole (PROTONIX) 80 mg /NS 100 mL IVPB (has no administration in time range)  pantoprozole (PROTONIX) 80 mg /NS 100 mL infusion (has no administration in time range)  pantoprazole (PROTONIX) injection 40 mg (has no administration in time range)  etomidate (AMIDATE) injection 20 mg (20 mg Intravenous Given 04/10/21 2219)  rocuronium (ZEMURON) injection 100 mg (100 mg Intravenous Given 04/10/21 2220)   Diagnoses considered: Most likely aspiration event leading to unresponsive episode.  CT imaging is not revealing of acute intracranial abnormality.  Unresponsive episode not thought to be due to large volume GI bleed as patient was not hypotensive on presentation to the ED.  Ddx includes alcohol, electrolyte abnormalities (Na/Ca), endocrine (myxedema coma, thyroid storm), encephalopathy (hepatic, HTN, heat, wernicke), infection (encephalitis, sepsis), toxidromes of overdose or withdrawal, hypoxia, uremia, trauma (hemorrhagic shock, TBI), hypothermia, hypoglycemia, psychogenic, seizure, stroke, or intracranial bleed. Unlikely dementia as acute in nature and not progressively degenerative.   Consulted: Neurology  Admit to the ICU as patient is intubated for GCS less than 8.  Patient seen in conjunction with Dr. Inez Catalina medical dictation software was used in the creation of this note.   Electronically signed by: Wynetta Fines, MD on 04/10/2021 at 10:36 PM  Clinical Impression: Unresponsive episode  Dispo: Imagene Riches, MD 04/13/21 1451    Gareth Morgan, MD 04/13/21 2227

## 2021-04-11 DIAGNOSIS — R571 Hypovolemic shock: Secondary | ICD-10-CM | POA: Diagnosis not present

## 2021-04-11 DIAGNOSIS — G9340 Encephalopathy, unspecified: Secondary | ICD-10-CM | POA: Insufficient documentation

## 2021-04-11 DIAGNOSIS — R7303 Prediabetes: Secondary | ICD-10-CM | POA: Diagnosis present

## 2021-04-11 DIAGNOSIS — K922 Gastrointestinal hemorrhage, unspecified: Secondary | ICD-10-CM | POA: Diagnosis not present

## 2021-04-11 DIAGNOSIS — Z79899 Other long term (current) drug therapy: Secondary | ICD-10-CM | POA: Diagnosis not present

## 2021-04-11 DIAGNOSIS — E872 Acidosis, unspecified: Secondary | ICD-10-CM | POA: Diagnosis not present

## 2021-04-11 DIAGNOSIS — R739 Hyperglycemia, unspecified: Secondary | ICD-10-CM | POA: Diagnosis not present

## 2021-04-11 DIAGNOSIS — N401 Enlarged prostate with lower urinary tract symptoms: Secondary | ICD-10-CM | POA: Diagnosis present

## 2021-04-11 DIAGNOSIS — K219 Gastro-esophageal reflux disease without esophagitis: Secondary | ICD-10-CM | POA: Diagnosis present

## 2021-04-11 DIAGNOSIS — G9341 Metabolic encephalopathy: Secondary | ICD-10-CM | POA: Diagnosis present

## 2021-04-11 DIAGNOSIS — Z91048 Other nonmedicinal substance allergy status: Secondary | ICD-10-CM | POA: Diagnosis not present

## 2021-04-11 DIAGNOSIS — I2699 Other pulmonary embolism without acute cor pulmonale: Secondary | ICD-10-CM | POA: Diagnosis not present

## 2021-04-11 DIAGNOSIS — I2609 Other pulmonary embolism with acute cor pulmonale: Secondary | ICD-10-CM | POA: Diagnosis not present

## 2021-04-11 DIAGNOSIS — Z8673 Personal history of transient ischemic attack (TIA), and cerebral infarction without residual deficits: Secondary | ICD-10-CM | POA: Diagnosis not present

## 2021-04-11 DIAGNOSIS — E860 Dehydration: Secondary | ICD-10-CM | POA: Diagnosis present

## 2021-04-11 DIAGNOSIS — J9811 Atelectasis: Secondary | ICD-10-CM | POA: Diagnosis not present

## 2021-04-11 DIAGNOSIS — R569 Unspecified convulsions: Secondary | ICD-10-CM | POA: Diagnosis present

## 2021-04-11 DIAGNOSIS — T17908A Unspecified foreign body in respiratory tract, part unspecified causing other injury, initial encounter: Secondary | ICD-10-CM

## 2021-04-11 DIAGNOSIS — Z20822 Contact with and (suspected) exposure to covid-19: Secondary | ICD-10-CM | POA: Diagnosis present

## 2021-04-11 DIAGNOSIS — E785 Hyperlipidemia, unspecified: Secondary | ICD-10-CM | POA: Diagnosis present

## 2021-04-11 DIAGNOSIS — K529 Noninfective gastroenteritis and colitis, unspecified: Secondary | ICD-10-CM | POA: Diagnosis present

## 2021-04-11 DIAGNOSIS — N179 Acute kidney failure, unspecified: Secondary | ICD-10-CM | POA: Insufficient documentation

## 2021-04-11 DIAGNOSIS — J9601 Acute respiratory failure with hypoxia: Secondary | ICD-10-CM | POA: Diagnosis present

## 2021-04-11 DIAGNOSIS — R338 Other retention of urine: Secondary | ICD-10-CM | POA: Diagnosis not present

## 2021-04-11 DIAGNOSIS — I10 Essential (primary) hypertension: Secondary | ICD-10-CM | POA: Diagnosis present

## 2021-04-11 DIAGNOSIS — J690 Pneumonitis due to inhalation of food and vomit: Secondary | ICD-10-CM | POA: Diagnosis present

## 2021-04-11 DIAGNOSIS — Z7901 Long term (current) use of anticoagulants: Secondary | ICD-10-CM | POA: Diagnosis not present

## 2021-04-11 DIAGNOSIS — I493 Ventricular premature depolarization: Secondary | ICD-10-CM | POA: Diagnosis not present

## 2021-04-11 LAB — BASIC METABOLIC PANEL
Anion gap: 10 (ref 5–15)
Anion gap: 12 (ref 5–15)
Anion gap: 9 (ref 5–15)
BUN: 23 mg/dL (ref 8–23)
BUN: 25 mg/dL — ABNORMAL HIGH (ref 8–23)
BUN: 23 mg/dL (ref 8–23)
CO2: 20 mmol/L — ABNORMAL LOW (ref 22–32)
CO2: 20 mmol/L — ABNORMAL LOW (ref 22–32)
CO2: 30 mmol/L (ref 22–32)
Calcium: 7.8 mg/dL — ABNORMAL LOW (ref 8.9–10.3)
Calcium: 7.6 mg/dL — ABNORMAL LOW (ref 8.9–10.3)
Calcium: 6.3 mg/dL — CL (ref 8.9–10.3)
Chloride: 108 mmol/L (ref 98–111)
Chloride: 110 mmol/L (ref 98–111)
Chloride: 101 mmol/L (ref 98–111)
Creatinine, Ser: 2.04 mg/dL — ABNORMAL HIGH (ref 0.61–1.24)
Creatinine, Ser: 2.19 mg/dL — ABNORMAL HIGH (ref 0.61–1.24)
Creatinine, Ser: 1.91 mg/dL — ABNORMAL HIGH (ref 0.61–1.24)
GFR, Estimated: 33 mL/min — ABNORMAL LOW (ref >60)
GFR, Estimated: 31 mL/min — ABNORMAL LOW (ref >60)
GFR, Estimated: 36 mL/min — ABNORMAL LOW (ref >60)
Glucose, Bld: 134 mg/dL — ABNORMAL HIGH (ref 70–99)
Glucose, Bld: 102 mg/dL — ABNORMAL HIGH (ref 70–99)
Glucose, Bld: 95 mg/dL (ref 70–99)
Potassium: 4.5 mmol/L (ref 3.5–5.1)
Potassium: 4 mmol/L (ref 3.5–5.1)
Potassium: 4.3 mmol/L (ref 3.5–5.1)
Sodium: 138 mmol/L (ref 135–145)
Sodium: 142 mmol/L (ref 135–145)
Sodium: 140 mmol/L (ref 135–145)

## 2021-04-11 LAB — URINALYSIS, ROUTINE W REFLEX MICROSCOPIC
Bilirubin Urine: NEGATIVE
Glucose, UA: 50 mg/dL — AB
Ketones, ur: 20 mg/dL — AB
Leukocytes,Ua: NEGATIVE
Nitrite: NEGATIVE
Protein, ur: 30 mg/dL — AB
Specific Gravity, Urine: >1.046 — ABNORMAL HIGH (ref 1.005–1.030)
pH: 5 (ref 5.0–8.0)

## 2021-04-11 LAB — POCT I-STAT 7, (LYTES, BLD GAS, ICA,H+H)
Acid-base deficit: 9 mmol/L — ABNORMAL HIGH (ref 0.0–2.0)
Acid-base deficit: 4 mmol/L — ABNORMAL HIGH (ref 0.0–2.0)
Bicarbonate: 17.2 mmol/L — ABNORMAL LOW (ref 20.0–28.0)
Bicarbonate: 21.5 mmol/L (ref 20.0–28.0)
Calcium, Ion: 1.2 mmol/L (ref 1.15–1.40)
Calcium, Ion: 1.19 mmol/L (ref 1.15–1.40)
HCT: 42 % (ref 39.0–52.0)
HCT: 39 % (ref 39.0–52.0)
Hemoglobin: 14.3 g/dL (ref 13.0–17.0)
Hemoglobin: 13.3 g/dL (ref 13.0–17.0)
O2 Saturation: 96 %
O2 Saturation: 93 %
Patient temperature: 98.6
Patient temperature: 98.3
Potassium: 4.5 mmol/L (ref 3.5–5.1)
Potassium: 4.4 mmol/L (ref 3.5–5.1)
Sodium: 140 mmol/L (ref 135–145)
Sodium: 142 mmol/L (ref 135–145)
TCO2: 18 mmol/L — ABNORMAL LOW (ref 22–32)
TCO2: 23 mmol/L (ref 22–32)
pCO2 arterial: 36.8 mmHg (ref 32–48)
pCO2 arterial: 41.1 mmHg (ref 32–48)
pH, Arterial: 7.278 — ABNORMAL LOW (ref 7.35–7.45)
pH, Arterial: 7.327 — ABNORMAL LOW (ref 7.35–7.45)
pO2, Arterial: 88 mmHg (ref 83–108)
pO2, Arterial: 73 mmHg — ABNORMAL LOW (ref 83–108)

## 2021-04-11 LAB — GLUCOSE, CAPILLARY
Glucose-Capillary: 105 mg/dL — ABNORMAL HIGH (ref 70–99)
Glucose-Capillary: 105 mg/dL — ABNORMAL HIGH (ref 70–99)
Glucose-Capillary: 120 mg/dL — ABNORMAL HIGH (ref 70–99)
Glucose-Capillary: 169 mg/dL — ABNORMAL HIGH (ref 70–99)
Glucose-Capillary: 215 mg/dL — ABNORMAL HIGH (ref 70–99)
Glucose-Capillary: 87 mg/dL (ref 70–99)
Glucose-Capillary: 99 mg/dL (ref 70–99)

## 2021-04-11 LAB — RAPID URINE DRUG SCREEN, HOSP PERFORMED
Amphetamines: NOT DETECTED
Barbiturates: NOT DETECTED
Benzodiazepines: POSITIVE — AB
Cocaine: NOT DETECTED
Opiates: NOT DETECTED
Tetrahydrocannabinol: NOT DETECTED

## 2021-04-11 LAB — CBC WITH DIFFERENTIAL/PLATELET
Abs Immature Granulocytes: 0.02 10*3/uL (ref 0.00–0.07)
Basophils Absolute: 0 10*3/uL (ref 0.0–0.1)
Basophils Relative: 0 %
Eosinophils Absolute: 0 10*3/uL (ref 0.0–0.5)
Eosinophils Relative: 0 %
HCT: 34.7 % — ABNORMAL LOW (ref 39.0–52.0)
Hemoglobin: 11.3 g/dL — ABNORMAL LOW (ref 13.0–17.0)
Immature Granulocytes: 1 %
Lymphocytes Relative: 6 %
Lymphs Abs: 0.3 10*3/uL — ABNORMAL LOW (ref 0.7–4.0)
MCH: 27.2 pg (ref 26.0–34.0)
MCHC: 32.6 g/dL (ref 30.0–36.0)
MCV: 83.6 fL (ref 80.0–100.0)
Monocytes Absolute: 0.2 10*3/uL (ref 0.1–1.0)
Monocytes Relative: 5 %
Neutro Abs: 3.8 10*3/uL (ref 1.7–7.7)
Neutrophils Relative %: 88 %
Platelets: 180 10*3/uL (ref 150–400)
RBC: 4.15 MIL/uL — ABNORMAL LOW (ref 4.22–5.81)
RDW: 14 % (ref 11.5–15.5)
WBC: 4.3 10*3/uL (ref 4.0–10.5)
nRBC: 0 % (ref 0.0–0.2)

## 2021-04-11 LAB — MRSA NEXT GEN BY PCR, NASAL: MRSA by PCR Next Gen: NOT DETECTED

## 2021-04-11 LAB — CBC
HCT: 44.4 % (ref 39.0–52.0)
Hemoglobin: 14.5 g/dL (ref 13.0–17.0)
MCH: 27.6 pg (ref 26.0–34.0)
MCHC: 32.7 g/dL (ref 30.0–36.0)
MCV: 84.6 fL (ref 80.0–100.0)
Platelets: 210 10*3/uL (ref 150–400)
RBC: 5.25 MIL/uL (ref 4.22–5.81)
RDW: 13.9 % (ref 11.5–15.5)
WBC: 1.2 10*3/uL — CL (ref 4.0–10.5)
nRBC: 0 % (ref 0.0–0.2)

## 2021-04-11 LAB — TSH: TSH: 2.785 u[IU]/mL (ref 0.350–4.500)

## 2021-04-11 LAB — MAGNESIUM
Magnesium: 1.6 mg/dL — ABNORMAL LOW (ref 1.7–2.4)
Magnesium: 1.5 mg/dL — ABNORMAL LOW (ref 1.7–2.4)
Magnesium: 1.6 mg/dL — ABNORMAL LOW (ref 1.7–2.4)

## 2021-04-11 LAB — LACTIC ACID, PLASMA: Lactic Acid, Venous: 3.5 mmol/L (ref 0.5–1.9)

## 2021-04-11 LAB — PHOSPHORUS: Phosphorus: 2.7 mg/dL (ref 2.5–4.6)

## 2021-04-11 LAB — TRIGLYCERIDES: Triglycerides: 39 mg/dL (ref <150)

## 2021-04-11 LAB — HEMOGLOBIN AND HEMATOCRIT, BLOOD
HCT: 43 % (ref 39.0–52.0)
Hemoglobin: 14.2 g/dL (ref 13.0–17.0)

## 2021-04-11 LAB — TROPONIN I (HIGH SENSITIVITY): Troponin I (High Sensitivity): 10 ng/L (ref <18)

## 2021-04-11 MED ORDER — CHLORHEXIDINE GLUCONATE CLOTH 2 % EX PADS
6.0000 | MEDICATED_PAD | Freq: Every day | CUTANEOUS | Status: DC
  Administered 2021-04-12 – 2021-04-15 (×6): 6 via TOPICAL

## 2021-04-11 MED ORDER — SODIUM CHLORIDE 0.9 % IV BOLUS
1000.0000 mL | Freq: Once | INTRAVENOUS | Status: AC
  Administered 2021-04-11: 1000 mL via INTRAVENOUS

## 2021-04-11 MED ORDER — HEPARIN SODIUM (PORCINE) 5000 UNIT/ML IJ SOLN: 5000.0000 [IU] | Freq: Three times a day (TID) | INTRAMUSCULAR | Status: DC

## 2021-04-11 MED ORDER — LACTATED RINGERS IV SOLN: INTRAVENOUS | Status: DC

## 2021-04-11 MED ORDER — CHLORHEXIDINE GLUCONATE 0.12% ORAL RINSE (MEDLINE KIT)
15.0000 mL | Freq: Two times a day (BID) | OROMUCOSAL | Status: DC
  Administered 2021-04-11 – 2021-04-12 (×4): 15 mL via OROMUCOSAL

## 2021-04-11 MED ORDER — PIPERACILLIN-TAZOBACTAM 3.375 G IVPB
3.3750 g | Freq: Three times a day (TID) | INTRAVENOUS | Status: DC
  Administered 2021-04-11 – 2021-04-13 (×7): 3.375 g via INTRAVENOUS
  Filled 2021-04-11 (×7): qty 50

## 2021-04-11 MED ORDER — MAGNESIUM SULFATE 2 GM/50ML IV SOLN
2.0000 g | Freq: Once | INTRAVENOUS | Status: AC
  Administered 2021-04-11: 2 g via INTRAVENOUS
  Filled 2021-04-11: qty 50

## 2021-04-11 MED ORDER — DOCUSATE SODIUM 50 MG/5ML PO LIQD: 100.0000 mg | Freq: Two times a day (BID) | ORAL | Status: DC | PRN

## 2021-04-11 MED ORDER — POLYETHYLENE GLYCOL 3350 17 G PO PACK: 17.0000 g | PACK | Freq: Every day | ORAL | Status: DC | PRN

## 2021-04-11 MED ORDER — PROPOFOL BOLUS VIA INFUSION
15.0000 mg | Freq: Once | INTRAVENOUS | Status: AC
  Administered 2021-04-11: 15 mg via INTRAVENOUS
  Filled 2021-04-11: qty 15

## 2021-04-11 MED ORDER — SODIUM CHLORIDE 0.9 % IV SOLN: 250.0000 mL | INTRAVENOUS | Status: DC

## 2021-04-11 MED ORDER — CALCIUM GLUCONATE-NACL 2-0.675 GM/100ML-% IV SOLN
2.0000 g | Freq: Once | INTRAVENOUS | Status: AC
  Administered 2021-04-11: 2000 mg via INTRAVENOUS
  Filled 2021-04-11: qty 100

## 2021-04-11 MED ORDER — ORAL CARE MOUTH RINSE
15.0000 mL | OROMUCOSAL | Status: DC
  Administered 2021-04-11 – 2021-04-13 (×21): 15 mL via OROMUCOSAL

## 2021-04-11 MED ORDER — SODIUM BICARBONATE 8.4 % IV SOLN
100.0000 meq | Freq: Once | INTRAVENOUS | Status: AC
  Administered 2021-04-11: 100 meq via INTRAVENOUS
  Filled 2021-04-11: qty 50

## 2021-04-11 MED ORDER — LEVETIRACETAM IN NACL 500 MG/100ML IV SOLN
500.0000 mg | Freq: Two times a day (BID) | INTRAVENOUS | Status: DC
Start: 2021-04-11 — End: 2021-04-13
  Administered 2021-04-11 – 2021-04-13 (×5): 500 mg via INTRAVENOUS
  Filled 2021-04-11 (×5): qty 100

## 2021-04-11 MED ORDER — SODIUM BICARBONATE 8.4 % IV SOLN
INTRAVENOUS | Status: AC
  Filled 2021-04-11: qty 50

## 2021-04-11 MED ORDER — INSULIN ASPART 100 UNIT/ML IJ SOLN
0.0000 [IU] | INTRAMUSCULAR | Status: DC
  Administered 2021-04-11: 3 [IU] via SUBCUTANEOUS
  Administered 2021-04-11: 5 [IU] via SUBCUTANEOUS
  Administered 2021-04-12 – 2021-04-13 (×2): 2 [IU] via SUBCUTANEOUS

## 2021-04-11 MED ORDER — SODIUM BICARBONATE 8.4 % IV SOLN
INTRAVENOUS | Status: DC
  Filled 2021-04-11 (×3): qty 1000

## 2021-04-11 MED ORDER — PIPERACILLIN-TAZOBACTAM 3.375 G IVPB 30 MIN
3.3750 g | Freq: Once | INTRAVENOUS | Status: AC
  Administered 2021-04-11: 3.375 g via INTRAVENOUS
  Filled 2021-04-11: qty 50

## 2021-04-11 MED ORDER — PHENYLEPHRINE HCL-NACL 20-0.9 MG/250ML-% IV SOLN
25.0000 ug/min | INTRAVENOUS | Status: DC
  Administered 2021-04-11: 180 ug/min via INTRAVENOUS
  Administered 2021-04-11: 190 ug/min via INTRAVENOUS
  Administered 2021-04-11: 160 ug/min via INTRAVENOUS
  Administered 2021-04-11: 190 ug/min via INTRAVENOUS
  Administered 2021-04-11: 25 ug/min via INTRAVENOUS
  Administered 2021-04-11: 170 ug/min via INTRAVENOUS
  Administered 2021-04-11: 160 ug/min via INTRAVENOUS
  Administered 2021-04-11: 140 ug/min via INTRAVENOUS
  Administered 2021-04-12: 110 ug/min via INTRAVENOUS
  Administered 2021-04-12: 160 ug/min via INTRAVENOUS
  Administered 2021-04-12: 140 ug/min via INTRAVENOUS
  Filled 2021-04-11 (×5): qty 250
  Filled 2021-04-11 (×2): qty 500
  Filled 2021-04-11 (×2): qty 250

## 2021-04-11 MED ORDER — LACTATED RINGERS IV BOLUS
1000.0000 mL | Freq: Once | INTRAVENOUS | Status: AC
  Administered 2021-04-11: 1000 mL via INTRAVENOUS

## 2021-04-11 NOTE — Progress Notes (Signed)
[facility] Progress Note Patient Name: Christopher Francis DOB: [birth year] MRN: [id number]   Date of Service  04/11/2021  HPI/Events of Note  Multiple issues: 1. ABG on 80%/PRVC 20/TV 580/P 5 = 7.278/36.8/88/17.2. 2. Hypotension = BP = 99/52. Hgb = 14.5.  eICU Interventions  Plan: NaHCO3 100 meq IV X 1 now. NaHCO3 IV infusion at 50 mL/hour. Repeat ABG at 8:30 AM. Bolus with 0.9 NaCl 1 liter IV over 1 hour now. Phenylephrine IV infusion via PIV. Titrate to MAP >= 65.     Intervention Category Major Interventions: Respiratory failure - evaluation and management;Acid-Base disturbance - evaluation and management;Hypotension - evaluation and management  Tyrone Balash Cornelia Copa 04/11/2021, 5:28 AM

## 2021-04-11 NOTE — Progress Notes (Signed)
NAME:  Shamarcus Hoheisel, MRN:  [id number], DOB:  Jul 28, 1945, LOS: 0 ADMISSION DATE:  04/10/2021, CONSULTATION DATE:  04/11/21 REFERRING MD:  Billy Fischer, CHIEF COMPLAINT:  AMS, seizures   History of Present Illness:  Christopher Francis is 76 yo M with past medical history of chest pain and seizures.  He presented to [facility] ED 2/20 with AMS.  He had GI symptoms that started 24 hour prior to presentation and he was noted to sleep 5-6 hrs during day which is unusual for him.  His wife stated that he got up to go to bathroom and she heard a thud then found him unresponsive in bathroom. EMS called and patient noted to have 2 seizures during transport.  He was intubated on arrival to ED and OG tube place with large amount of dark coffee-ground like output, 750 cc.  Seen by neuro in ED. Loaded with Keppra and EEG started.  Pertinent  Medical History  Chest pain Seizures  Significant Hospital Events: Including procedures, antibiotic start and stop dates in addition to other pertinent events   2/20 found unresponsive on floor at home, intubated  Interim History / Subjective:  Patient with hypotension overnight, Neo started. ABG showed metabolic acidosis.  Objective   Blood pressure (!) 86/62, pulse 93, temperature 98.6 F (37 C), temperature source Axillary, resp. rate 20, height 5' 8.5" (1.74 m), weight 84.8 kg, SpO2 92 %.    Vent Mode: PRVC FiO2 (%):  [80 %-100 %] 80 % Set Rate:  [20 bmp] 20 bmp Vt Set:  [580 mL] 580 mL PEEP:  [5 cmH20-8 cmH20] 8 cmH20 Plateau Pressure:  [14 cmH20-16 cmH20] 14 cmH20   Intake/Output Summary (Last 24 hours) at 04/11/2021 0745 Last data filed at 04/11/2021 0200 Gross per 24 hour  Intake 1357.75 ml  Output --  Net 1357.75 ml   Filed Weights   04/11/21 0016 04/11/21 0500  Weight: 81.5 kg 84.8 kg    Examination: General:  In bed on vent HENT: NCAT ETT in place PULM: rhonchi in lungs diffusely, vent supported breathing, dark output from OG tube also in filter from  ETT. CV: RRR, no mgr GI: BS+, soft, nontender, dark output from OG tube, 100cc in canister MSK: normal bulk and tone Neuro: sedated on vent, pupils equal and reactive to light   Labs/imaging personally reviewed: Mg 1.5, K 4.0 CO2 20 Creatinine 2.04-> 2.19, GFR 31, BUN 25 Hgb 14.2 WBC 17-> 1.2 7.2/36.8/88/17.2 to 7.3/41.1/73/21.5 Lactate 3.5  CTA head neck- no intracranial large vessel occlusion or significant stenosis  CTA chest/ Abd/ pelvis showed complete bronchial filling of bronchus intermedius, multifocal ground-glass nodularity within both lungs, fluid-filled and mildly prominent small bowel, no discrete transition point to suggest obstruction.   Resolved Hospital Problem list     Assessment & Plan:  Acute metabolic encephalopathy Seizure  No prior history of seizure. Ammonia was wnl. EEG negative for seizure or status elilepticus. Wife states patient has a prior history of passing out since childhood. 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  -Neurology consulted and following - Neuro following, appreciate the assistance. - AED's per neuro. - F/u UDS, MRI brain.  Acute hypoxic respiratory failure Aspiration, mucous plugging on imaging  Leukocytosis CT chest showed multifocal ground glass nodularity bilaterally.  Dark ground output from OG tube also in filter from ETT tube. WBC elevated at 17 with neutrophil predominance initially. Repeat WBC this AM 1.2, will recheck this PM. - Maintain full vent support with SAT/SBT as tolerated -  titrate Vent setting to maintain SpO2 greater than or equal to 90%. - HOB elevated 30 degrees. - Plateau pressures less than 30 cm H20.  - Follow chest x-ray, ABG prn.   - Bronchial hygiene and RT/bronchodilator protocol. - levophed to keep MAP >65 - Empiric Zosyn - repeat CBC this PM  Coffee ground output from OGT, GI bleed vs Copremesis Patient continues to have dark output from OGT.  Hgb stable at 14.2.   CT abd showed fluid within stomach and esophagus, small bowel is fluid-filled and mildly prominent, no discrete transition point to suggest obstruction.  -GI consult -trend CBC -continue PPI  AKI Urinary retention Baseline creatinine 1.1.  Creatinine 2.04-> 2.19, GFR 31, BUN 25. Patient with history of diarrheal state for prior 24 hours. He appears dehydrated. He has had decreased urine output over last 12 hrs with In and out cath of 600cc urine. CT abd showed enlarged prostate.  - LR bolus - holding home medication ACEI- HCTZ combo - UA - strict I and Os  NAGMA Metabolic acidosis History of diarrhea Bicarb at 20 this AM. Patient with history of diarrhea for last 2 days. CT abd consistent with enteritis and diarrheal process. ABG this AM 7.2/36.8/88/17.2.  He was given bicarb and repeat ABG showed 7.3/41.1/73/21.5. - trend BMP - holding home medication ACEI- HCTZ combo - consider GI panel if continued bowel movements - bicarb given this AM  Hyperglycemia Pre-diabetes Last A1c 7/22 at 6.7 -[facility]  Best Practice (right click and "Reselect all SmartList Selections" daily)   Diet/type: NPO DVT prophylaxis: other GI prophylaxis: PPI Lines: N/A Foley:  N/A Code Status:  full code Last date of multidisciplinary goals of care discussion [pending]  Sachi Boulay M. Maille Halliwell, D.O.  Internal Medicine Resident, PGY-1 [facility]  Pager: [phone] 7:45 AM, 04/11/2021

## 2021-04-11 NOTE — Progress Notes (Addendum)
Pharmacy Antibiotic Note  Jakhai Fant is a 76 y.o. male admitted on 04/10/2021 with  GIB and seizure w/ concern for aspiration PNA and possible intra-abdominal infection .  Pharmacy has been consulted for Zosyn dosing.  Plan: Zosyn 3.375g IV q8h (4 hour infusion).  Height: 5' 8.5" (174 cm) Weight: 81.5 kg (179 lb 10.8 oz) IBW/kg (Calculated) : 69.55  Temp (24hrs), Avg:96.8 F (36 C), Min:96.8 F (36 C), Max:96.8 F (36 C)  Recent Labs  Lab 04/10/21 2227 04/10/21 2230 04/10/21 2236  WBC 17.0*  --   --   CREATININE 1.76* 1.80*  --   LATICACIDVEN  --   --  4.7*    Estimated Creatinine Clearance: 34.9 mL/min (A) (by C-G formula based on SCr of 1.8 mg/dL (H)).    Allergies  Allergen Reactions   Tape     Adhesive tape    Thank you for allowing pharmacy to be a part of this patients care.  Wynona Neat, PharmD, BCPS  04/11/2021 12:19 AM

## 2021-04-11 NOTE — H&P (Signed)
NAME:  Christopher Francis, MRN:  [id number], DOB:  1945-06-24, LOS: 0 ADMISSION DATE:  04/10/2021, CONSULTATION DATE:  04/11/20 REFERRING MD:  Billy Fischer CHIEF COMPLAINT:  AMS, seizures   History of Present Illness:  Christopher Francis is a 76 y.o. male who has a PMH as outlined below.  He presented to [facility] ED 2/20 with altered mental status.  He had apparently not been feeling well earlier that day due to GI symptoms including nausea and diarrhea per family.  Symptoms started the night prior and lingered. On day of presentation, he slept for 5 to 6 hours which was unusual for him.  Prior to ED presentation, he went to use the bathroom and wife then heard a thud.  She found him to be unresponsive in the bathroom.  EMS was called and during transport, he was noted to have 2 seizures for which he was given IV Versed.  On arrival in ED, he was intubated.  OG tube was placed and he had large amount of dark coffee-ground-like output, almost 750 cc.  No history of GI bleed per family. No known anticoagulation or antiplatelet use.  Seen by neuro in ED.  Loaded with Keppra and EEG started.  Pertinent  Medical History:  has Chest pain and Seizures ([place]) on their problem list.  Significant Hospital Events: Including procedures, antibiotic start and stop dates in addition to other pertinent events   2/21 > admit.  Interim History / Subjective:  Sedated on vent.  On Fentanyl infusion. Large amount dark coffee ground output from OGT.  Objective:  Blood pressure (!) 185/78, pulse (!) 105, temperature (!) 96.8 F (36 C), temperature source Temporal, resp. rate 20, SpO2 100 %.    Vent Mode: PRVC FiO2 (%):  [100 %] 100 % Set Rate:  [20 bmp] 20 bmp Vt Set:  [580 mL] 580 mL PEEP:  [5 cmH20] 5 cmH20 Plateau Pressure:  [16 cmH20] 16 cmH20   Intake/Output Summary (Last 24 hours) at 04/11/2021 0003 Last data filed at 04/10/2021 2342 Gross per 24 hour  Intake 1100 ml  Output --  Net 1100 ml   There were no vitals  filed for this visit.  Examination: General: Adult male, resting in bed, in NAD. Neuro: Sedated, not responsive. HEENT: [place]/AT. Sclerae anicteric. ETT in place. Cardiovascular: RRR, no M/R/G.  Lungs: Respirations even and unlabored.  CTA bilaterally, No W/R/R. Abdomen: BS x 4, soft, NT/ND. OGT with bloody coffee ground output. Musculoskeletal: No gross deformities, no edema.  Skin: Intact, warm, no rashes.  Labs/imaging personally reviewed:  CT and CTA head / neck 1/20 > no acute process.  Mild cerebral atrophy.  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  Moderate severity paranasal sinus disease. CTA chest / abd / pelv 1/20 > mucous plugging, concern for aspiration, diarrheal process, colonic diverticulosis without diverticulitis, cholelithiasis without acute cholecystitis, enlarged prostate gland.  Assessment & Plan:   Respiratory insufficiency with inability to protect airway - s/p intubation. Concern for aspiration PNA. Mucous plugging noted  on imaging. - Full vent support. - Wean as mental status allows. - Bronchial hygiene. - Empiric Zosyn. - Follow CXR.  Seizures - no known hx. - Neuro following, appreciate the assistance. - EEG and AED's per neuro. - F/u UDS, MRI brain.  GIB - large amount coffee ground output from OGT, no hx per family. Hgb reassuring (13.3). Nausea and diarrhea - unclear etiology at this point. - Continue PPI infusion for now. - Monitor for ongoing bleeding. - GI  consult in AM. - GI pathogen testing if diarrhea persists.  AKI. - Gentle hydration. - Follow BMP. - Hold home Prinzide.  Hyperglycemia. - SSI.    Leukocytosis - presumed aspiration and can not rule out intraabdominal infection given hx nausea and diarrhea. - Empiric zosyn. - GI pathogen testing if diarrhea persists.  Best practice (evaluated daily):  Diet/type: NPO DVT prophylaxis: not indicated GI prophylaxis: PPI Lines: N/A Foley:  N/A Code Status:  full  code Last date of multidisciplinary goals of care discussion: None yet.  Labs   CBC: Recent Labs  Lab 04/10/21 2227 04/10/21 2230 04/10/21 2321  WBC 17.0*  --   --   NEUTROABS 14.5*  --   --   HGB 15.5 17.0 13.3  HCT 49.2 50.0 39.0  MCV 86.3  --   --   PLT 292  --   --     Basic Metabolic Panel: Recent Labs  Lab 04/10/21 2227 04/10/21 2230 04/10/21 2321  NA 135 137 134*  K 4.4 4.2 4.5  CL 103 106  --   CO2 17*  --   --   GLUCOSE 254* 256*  --   BUN 21 22  --   CREATININE 1.76* 1.80*  --   CALCIUM 9.0  --   --    GFR: CrCl cannot be calculated (Unknown ideal weight.). Recent Labs  Lab 04/10/21 2227 04/10/21 2236  WBC 17.0*  --   LATICACIDVEN  --  4.7*    Liver Function Tests: Recent Labs  Lab 04/10/21 2227  AST 22  ALT 18  ALKPHOS 65  BILITOT 0.6  PROT 6.4*  ALBUMIN 3.8   No results for input(s): LIPASE, AMYLASE in the last 168 hours. Recent Labs  Lab 04/10/21 2227  AMMONIA 26    ABG    Component Value Date/Time   PHART 7.260 (L) 04/10/2021 2321   PCO2ART 42.5 04/10/2021 2321   PO2ART 52 (L) 04/10/2021 2321   HCO3 19.3 (L) 04/10/2021 2321   TCO2 21 (L) 04/10/2021 2321   ACIDBASEDEF 8.0 (H) 04/10/2021 2321   O2SAT 83 04/10/2021 2321     Coagulation Profile: Recent Labs  Lab 04/10/21 2227  INR 1.1    Cardiac Enzymes: No results for input(s): CKTOTAL, CKMB, CKMBINDEX, TROPONINI in the last 168 hours.  HbA1C: No results found for: HGBA1C  CBG: No results for input(s): GLUCAP in the last 168 hours.  Review of Systems:   Unable to obtain as pt is encephalopathic.  Past Medical History:  He,  has a past medical history of Gastroesophageal reflux disease, Hemorrhoids, and Hypertension.   Surgical History:   Past Surgical History:  Procedure Laterality Date   APPENDECTOMY  2014     Social History:   reports that he has never smoked. He does not have any smokeless tobacco history on file.   Family History:  His family  history includes Heart disease in his father.   Allergies Allergies  Allergen Reactions   Tape     Adhesive tape     Home Medications  Prior to Admission medications   Medication Sig Start Date End Date Taking? Authorizing Provider  Inulin (METAMUCIL CLEAR & NATURAL) POWD Take 1 scoop by mouth daily. Take one scoop daily    [provider]  lisinopril-hydrochlorothiazide (PRINZIDE,ZESTORETIC) 10-12.5 MG per tablet Take 1 tablet by mouth daily.    [provider]  Omeprazole 20 MG TBEC Take 20 mg by mouth daily.    [provider]     Critical care time: 40 min.   Montey Hora, [facility] For pager details, please see AMION or use Epic chat  After 1900, please call [place] for cross coverage needs 04/11/2021, 12:03 AM

## 2021-04-11 NOTE — Progress Notes (Signed)
°  Transition of Care [facility]) Screening Note   Patient Details  Name: Christopher Francis Date of Birth: December 08, 1945   [facility] CM/SW Contact:    Benard Halsted, LCSW Phone Number: 04/11/2021, 2:04 PM    Transition of Care Department [facility]) has reviewed patient and no TOC needs have been identified at this time. We will continue to monitor patient advancement through interdisciplinary progression rounds. If new patient transition needs arise, please place a TOC consult.

## 2021-04-11 NOTE — Progress Notes (Signed)
Stat EEG negative for seizures.  Generalized slowing only.  No need for LTM EEG. CTA head and neck read pending CTA chest abdomen pelvis with no evidence of aortic dissection.  Diffuse bronchial thickening and areas of mucous plugging along with multifocal tree-in-bud groundglass nodularity within both lungs, perihilar and dependent predominant with fluid distending distal esophagus-suspicious for aspiration.  Fluid-filled and mildly predominant small bowel with no discrete transition point to suggest obstruction-findings.  Present enteritis/ileus or slow transit.  There is also liquid stool in the colon suggesting a diarrheal process.  Colonic diverticulosis without diverticulitis.  Cholelithiasis without acute cholecystitis.   Impression: -Unclear cause of altered mental status -Concern for seizure-EEG negative for seizure or status epilepticus -Due to the aspiration pneumonia, might have been hypoxic for a while-MRI would be helpful to evaluate for any structural evidence for hypoxic brain injury.  Updated recommendations: Continue Keppra for now-500 twice daily.  Might not need in the longer term. No need for long-term EEG at this time. MRI of the brain when able to Leukocytosis likely secondary to aspiration-treatment per CCM  Neurology will follow  Plan discussed with the critical care team APP R. Desai over the phone.  -- Amie Portland, MD Neurologist [facility] Pager: [phone]  Additional  15 min CC time.

## 2021-04-11 NOTE — Consult Note (Addendum)
Consultation  Referring Provider: CCM/[place] Primary Care Physician:  [name], MD Primary Gastroenterologist:  Dr.Cornella ? [facility]  Reason for Consultation:  coffee ground in OG , abnormal CT  HPI: Christopher Francis is a 76 y.o. male, generally in good health with history of GERD and hypertension who was admitted yesterday after a syncopal episode at home in the bathroom.  Apparently had been feeling ill yesterday with diarrhea and nausea and had slept much of the day.  His wife heard a thump in the bathroom and he was found to be unresponsive.  EMS was called and felt that they had witnessed 2 seizures.  He remained unresponsive on arrival in the ER, was intubated, and OG was placed with return of dark/coffee-ground appearing material. Call neuro was consulted and initial EEG was negative showing generalized slowing.  He had formal EEG today more consistent with a diffuse encephalopathy. Patient has been hypotensive and is requiring pressors, initial lactate 3.5 , Initial WBC 17,000/creatinine 1.8 CT of the chest abdomen and pelvis last evening showed fluid in the esophagus, complete filling of the right bronchus and right upper lobe mucous plugging, multifocal tree-in-bud groundglass nodularity, noted to have multiple gallstones, no gallbladder wall thickening, there is fluid in the stomach small bowel and also felt to have a fluid/liquid filled colon, no transition point, patient is status post appendectomy, no pneumatosis.  Per CCM is felt to have aspirated and now has mucous plugging.  He is being covered with broad-spectrum antibiotics  He has had very little OG output all day-about 100 cc of very dark black material.  He has not had any bowel movements or diarrhea since admission.  WBC on admit 17,000> 1.2 today Hemoglobin 15> 14.5> 10.2 Creatinine 1.8> 2.04 today BUN 22> 23 today UA +20 1-50 RBCs, culture pending CT angio of the head and neck no intracranial large  vessel occlusion or significant stenosis  To have MRI of the head later today  Patient remains intubated, sedated, unable to participate in history.   Past Medical History:  Diagnosis Date   Gastroesophageal reflux disease    Hemorrhoids    Hypertension     Past Surgical History:  Procedure Laterality Date   APPENDECTOMY  2014    Prior to Admission medications   Medication Sig Start Date End Date Taking? Authorizing Provider  Inulin (METAMUCIL CLEAR & NATURAL) POWD Take 1 scoop by mouth daily. Take one scoop daily    [provider]  lisinopril-hydrochlorothiazide (PRINZIDE,ZESTORETIC) 10-12.5 MG per tablet Take 1 tablet by mouth daily.    [provider]  Omeprazole 20 MG TBEC Take 20 mg by mouth daily.    [provider]    Current Facility-Administered Medications  Medication Dose Route Frequency Provider Last Rate Last Admin   0.9 %  sodium chloride infusion  250 mL Intravenous Continuous Anders Simmonds, MD       chlorhexidine gluconate (MEDLINE KIT) (PERIDEX) 0.12 % solution 15 mL  15 mL Mouth Rinse BID Amie Portland, MD   15 mL at 04/11/21 0850   Chlorhexidine Gluconate Cloth 2 % PADS 6 each  6 each Topical Daily Amie Portland, MD       docusate (COLACE) 50 MG/5ML liquid 100 mg  100 mg Per Tube BID PRN Desai, Rahul P, PA-C       fentaNYL (SUBLIMAZE) bolus via infusion 25 mcg  25 mcg Intravenous Q1H PRN Gareth Morgan, MD       fentaNYL 2515mg in NS  237m (183m/ml) infusion-PREMIX  25-400 mcg/hr Intravenous Continuous ScGareth MorganMD 17.5 mL/hr at 04/11/21 1300 175 mcg/hr at 04/11/21 1300   insulin aspart (novoLOG) injection 0-15 Units  0-15 Units Subcutaneous Q4H Desai, Rahul P, PA-C   3 Units at 04/11/21 0330   lactated ringers infusion   Intravenous Continuous Desai, Rahul P, PA-C 75 mL/hr at 04/11/21 1300 Infusion Verify at 04/11/21 1300   levETIRAcetam (KEPPRA) IVPB 500 mg/100 mL premix  500 mg Intravenous Q12H ArAmie PortlandMD    Paused at 04/11/21 [id number] MEDLINE mouth rinse  15 mL Mouth Rinse 10 times per day ArAmie PortlandMD   15 mL at 04/11/21 1447   [START ON 04/14/2021] pantoprazole (PROTONIX) injection 40 mg  40 mg Intravenous Q12H LeWynetta FinesMD       pantoprozole (PROTONIX) 80 mg /NS 100 mL infusion  8 mg/hr Intravenous Continuous LeWynetta FinesMD 10 mL/hr at 04/11/21 1300 8 mg/hr at 04/11/21 1300   phenylephrine (NEO-SYNEPHRINE) 2052mS 250m68memix infusion  25-200 mcg/min Intravenous Titrated SommAnders Simmonds 142.5 mL/hr at 04/11/21 1300 190 mcg/min at 04/11/21 1300   piperacillin-tazobactam (ZOSYN) IVPB 3.375 g  3.375 g Intravenous Q8H BrykLaren EvertsH 12.5 mL/hr at 04/11/21 1453 3.375 g at 04/11/21 1453   polyethylene glycol (MIRALAX / GLYCOLAX) packet 17 g  17 g Per Tube Daily PRN Desai, Rahul P, PA-C       propofol (DIPRIVAN) 1000 MG/100ML infusion  0-50 mcg/kg/min (Order-Specific) Intravenous Continuous SchlGareth Morgan 12.6 mL/hr at 04/11/21 1300 25 mcg/kg/min at 04/11/21 1300   sodium bicarbonate 1 mEq/mL injection            sodium bicarbonate 150 mEq in sterile water 1,150 mL infusion   Intravenous Continuous SommAnders Simmonds 50 mL/hr at 04/11/21 1300 Infusion Verify at 04/11/21 1300    Allergies as of 04/10/2021 - Review Complete 04/10/2021  Allergen Reaction Noted   Tape  11/05/2012    Family History  Problem Relation Age of Onset   Heart disease Father     Social History   Socioeconomic History   Marital status: Married    Spouse name: Not on file   Number of children: Not on file   Years of education: Not on file   Highest education level: Not on file  Occupational History   Not on file  Tobacco Use   Smoking status: Never   Smokeless tobacco: Not on file  Substance and Sexual Activity   Alcohol use: Not on file   Drug use: Not on file   Sexual activity: Not on file  Other Topics Concern   Not on file  Social History Narrative   The patient is  retired. He is married. He does not smoke cigarettes or drink alcohol.   Social Determinants of Health   Financial Resource Strain: Not on file  Food Insecurity: Not on file  Transportation Needs: Not on file  Physical Activity: Not on file  Stress: Not on file  Social Connections: Not on file  Intimate Partner Violence: Not on file    Review of Systems: Pertinent positive and negative review of systems were noted in the above HPI section.  All other review of systems was otherwise negative.   Physical Exam: Vital signs in last 24 hours: Temp:  [96.8 F (36 C)-99.5 F (37.5 C)] 99.5 F (37.5 C) (02/21 1153) Pulse Rate:  [62-112] 66 (02/21 1345) Resp:  [13-29] 20 (02/21 1345) BP: (65-185)/(47-107) 130/50 (02/21  1345) SpO2:  [91 %-100 %] 97 % (02/21 1345) FiO2 (%):  [70 %-100 %] 70 % (02/21 1141) Weight:  [81.5 kg-84.8 kg] 84.8 kg (02/21 0500)   General:   Alert,  Well-developed, well-nourished, older white male, intubated and sedated Head:  Normocephalic and atraumatic. Eyes:  Sclera clear, no icterus.   Conjunctiva pink. Ears:  Normal auditory acuity. Nose:  No deformity, discharge,  or lesions. Mouth:  No deformity or lesions.  OG in place Neck:  Supple; no masses or thyromegaly. Lungs: Intubated, bilateral coarse breath sounds  heart:  Regular rate and rhythm; no murmurs, clicks, rubs,  or gallops. Abdomen:  Soft,nontender, nondistended, BS active,nonpalp mass or hsm.   Rectal: Not done Msk:  Symmetrical without gross deformities. . Pulses:  Normal pulses noted. Extremities:  Without clubbing or edema. Neurologic:  Alert and  oriented x4;  grossly normal neurologically. Skin:  Intact without significant lesions or rashes.. Psych:  Alert and cooperative. Normal mood and affect.  Intake/Output from previous day: 02/20 0701 - 02/21 0700 In: 1357.8 [I.V.:1110.5; IV Piggyback:247.3] Out: -  Intake/Output this shift: Total I/O In: 4401 [I.V.:2082.3; IV  Piggyback:2318.6] Out: 600 [Urine:600]  Lab Results: Recent Labs    04/10/21 2227 04/10/21 2230 04/11/21 0420 04/11/21 0659 04/11/21 0920 04/11/21 1402  WBC 17.0*  --  1.2*  --   --  4.3  HGB 15.5   < > 14.5 14.2 13.3 11.3*  HCT 49.2   < > 44.4 43.0 39.0 34.7*  PLT 292  --  210  --   --  180   < > = values in this interval not displayed.   BMET Recent Labs    04/10/21 2227 04/10/21 2230 04/10/21 2321 04/11/21 0420 04/11/21 0659 04/11/21 0920  NA 135 137   < > 138 142 142  K 4.4 4.2   < > 4.5 4.0 4.4  CL 103 106  --  108 110  --   CO2 17*  --   --  20* 20*  --   GLUCOSE 254* 256*  --  134* 102*  --   BUN 21 22  --  23 25*  --   CREATININE 1.76* 1.80*  --  2.04* 2.19*  --   CALCIUM 9.0  --   --  7.8* 7.6*  --    < > = values in this interval not displayed.   LFT Recent Labs    04/10/21 2227  PROT 6.4*  ALBUMIN 3.8  AST 22  ALT 18  ALKPHOS 65  BILITOT 0.6   PT/INR Recent Labs    04/10/21 2227  LABPROT 14.5  INR 1.1   Hepatitis Panel No results for input(s): HEPBSAG, HCVAB, HEPAIGM, HEPBIGM in the last 72 hours.    IMPRESSION:  #73 76 year old white male with onset of acute illness yesterday initially with nausea and diarrhea, followed by syncopal episode at home, remained unresponsive on arrival to ER and hypotensive. Initial concerns for seizure-EEG negative and formal EEG today more consistent with diffuse encephalopathy  Precipitating event unclear-suspect acute gastroenteritis/infectious with sepsis  CT imaging of the abdomen shows fluid in the esophagus, stomach and small bowel most consistent with an early ileus, no transition point, also has fluid-filled colon consistent with diarrheal illness  #2 coffee-ground effluent from OG-pt  is not manifesting any active GI bleeding Hemoglobin overall stable since admission  #3 AKI #4 acute hypoxic respiratory failure-with evidence for aspiration and mucous plugging on imaging   PLAN: Currently on  PPI infusion, if still no evidence for active bleeding by tomorrow would change to twice daily IV Protonix Send OG aspirate for Gastroccult/Hemoccult Continue to trend hemoglobin Expect patient will develop diarrhea, have ordered GI path panel and C. difficile quick screen and starts having stools No indication for EGD at present GI will follow with you.   Amy Esterwood PA-C 04/11/2021, 3:06 PM     Attending physician's note   I have taken reviewed the chart and examined the patient. I performed a substantive portion of this encounter, including complete performance of at least one of the key components, in conjunction with the APP. I agree with the Advanced Practitioner's note, impression and recommendations.   76yrold found to be unresponsive after SZ like activity, with resp/circ failure - currently intubated and on pressors  Had nausea and diarrhea prior. CT AP showing early ileus and fluid in colon c/w diarrheal illness. Had CG OG aspirate which seems to have cleared. Now dark green. Hb stable. Doubt ischemic bowel at this time.  Neg prev GI WU included neg colon 09/2018, 05/2015, SB series 10/2016  Plan: -Hold off on EGD unless active bleeding. -IV protonix -Continue supportive Rx. -If diarrhea, need stool for GI pathogens, C Diff -Serial X-KUBs -Monitor and correct electrolytes. 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 Prev GI physician Dr. GLavina Hammanwas planning to do enteroscopy but Mr ABertschnever followed up with him afterwards.   RCarmell Austria MD LVelora HecklerGI [phone]

## 2021-04-11 NOTE — Progress Notes (Signed)
[place] Progress Note Patient Name: Christopher Francis DOB: February 07, 1946 MRN: [id number]   Date of Service  04/11/2021  HPI/Events of Note  Hypotension - BP = 94/47 with MAP = 59. Last LVEF = 55%. No CVL or CVP. Last Lactic Acid = 3.5. Hgb = 13.3.  eICU Interventions  Will bolus with 0.9 NaCl 1 liter IV over 1 hour now.      Intervention Category Major Interventions: Hypotension - evaluation and management  Zynia Wojtowicz [place] 04/11/2021, 3:30 AM

## 2021-04-11 NOTE — Procedures (Signed)
Patient Name: Christopher Francis  MRN: [id number]  Epilepsy Attending: Lora Havens  Referring Physician/Provider: Amie Portland, MD Date: 04/10/2021 Duration: 22.25 mins  Patient history: 76 year old male found unresponsive.  EMS was called and reported 2 seizures for which she received Versed.  EEG evaluate for seizure  Level of alertness: lethargic   AEDs during EEG study: LEV  Technical aspects: This EEG study was done with scalp electrodes positioned according to the 10-20 International system of electrode placement. Electrical activity was acquired at a sampling rate of 500Hz  and reviewed with a high frequency filter of 70Hz  and a low frequency filter of 1Hz . EEG data were recorded continuously and digitally stored.   Description: EEG showed continuous generalized low amplitude mixed frequencies with predominantly 6 to 9 Hz theta-alpha activity admixed with intermittent generalized 2 to 3 Hz delta slowing.  Hyperventilation and photic stimulation were not performed.     ABNORMALITY - Continuous slow, generalized  IMPRESSION: This study is suggestive of moderate diffuse encephalopathy, nonspecific etiology. No seizures or epileptiform discharges were seen throughout the recording.  [name] Barbra Sarks

## 2021-04-12 ENCOUNTER — Inpatient Hospital Stay (HOSPITAL_COMMUNITY): Payer: Medicare (Managed Care)

## 2021-04-12 ENCOUNTER — Telehealth: Payer: Self-pay

## 2021-04-12 DIAGNOSIS — R739 Hyperglycemia, unspecified: Secondary | ICD-10-CM | POA: Diagnosis not present

## 2021-04-12 DIAGNOSIS — R569 Unspecified convulsions: Secondary | ICD-10-CM | POA: Diagnosis not present

## 2021-04-12 DIAGNOSIS — T17908A Unspecified foreign body in respiratory tract, part unspecified causing other injury, initial encounter: Secondary | ICD-10-CM | POA: Diagnosis not present

## 2021-04-12 DIAGNOSIS — G9340 Encephalopathy, unspecified: Secondary | ICD-10-CM | POA: Diagnosis not present

## 2021-04-12 LAB — CBC
HCT: 36.2 % — ABNORMAL LOW (ref 39.0–52.0)
Hemoglobin: 11.8 g/dL — ABNORMAL LOW (ref 13.0–17.0)
MCH: 27.5 pg (ref 26.0–34.0)
MCHC: 32.6 g/dL (ref 30.0–36.0)
MCV: 84.4 fL (ref 80.0–100.0)
Platelets: 170 10*3/uL (ref 150–400)
RBC: 4.29 MIL/uL (ref 4.22–5.81)
RDW: 14.6 % (ref 11.5–15.5)
WBC: 9.9 10*3/uL (ref 4.0–10.5)
nRBC: 0 % (ref 0.0–0.2)

## 2021-04-12 LAB — BASIC METABOLIC PANEL
Anion gap: 9 (ref 5–15)
BUN: 30 mg/dL — ABNORMAL HIGH (ref 8–23)
CO2: 23 mmol/L (ref 22–32)
Calcium: 8.1 mg/dL — ABNORMAL LOW (ref 8.9–10.3)
Chloride: 110 mmol/L (ref 98–111)
Creatinine, Ser: 1.82 mg/dL — ABNORMAL HIGH (ref 0.61–1.24)
GFR, Estimated: 38 mL/min — ABNORMAL LOW (ref >60)
Glucose, Bld: 123 mg/dL — ABNORMAL HIGH (ref 70–99)
Potassium: 4.5 mmol/L (ref 3.5–5.1)
Sodium: 142 mmol/L (ref 135–145)

## 2021-04-12 LAB — GLUCOSE, CAPILLARY
Glucose-Capillary: 124 mg/dL — ABNORMAL HIGH (ref 70–99)
Glucose-Capillary: 102 mg/dL — ABNORMAL HIGH (ref 70–99)
Glucose-Capillary: 111 mg/dL — ABNORMAL HIGH (ref 70–99)
Glucose-Capillary: 107 mg/dL — ABNORMAL HIGH (ref 70–99)
Glucose-Capillary: 91 mg/dL (ref 70–99)
Glucose-Capillary: 72 mg/dL (ref 70–99)

## 2021-04-12 LAB — HEMOGLOBIN A1C
Hgb A1c MFr Bld: 6.4 % — ABNORMAL HIGH (ref 4.8–5.6)
Mean Plasma Glucose: 137 mg/dL

## 2021-04-12 LAB — OCCULT BLOOD GASTRIC / DUODENUM (SPECIMEN CUP): Occult Blood, Gastric: POSITIVE — AB

## 2021-04-12 MED ORDER — HALOPERIDOL LACTATE 5 MG/ML IJ SOLN
5.0000 mg | Freq: Four times a day (QID) | INTRAMUSCULAR | Status: DC | PRN
  Filled 2021-04-12: qty 1

## 2021-04-12 NOTE — Progress Notes (Addendum)
Subjective: ICU Nurse at bedside. Patient just extubated. Very agitated and confused. RN reports patient was initially following commands when intubated but became extremely restless and confused following extubation. Patient unable to follow commands and appears unwilling to cooperate with assessment at this time.  Objective: Current vital signs: BP (!) 142/60    Pulse (!) 55    Temp 98.6 F (37 C) (Oral)    Resp 20    Ht 5' 8.5" (1.74 m)    Wt 89.4 kg    SpO2 100%    BMI 29.53 kg/m  Vital signs in last 24 hours: Temp:  [98.6 F (37 C)-100.8 F (38.2 C)] 98.6 F (37 C) (02/22 0400) Pulse Rate:  [49-88] 55 (02/22 0745) Resp:  [0-24] 20 (02/22 0745) BP: (65-149)/(38-100) 142/60 (02/22 0745) SpO2:  [92 %-100 %] 100 % (02/22 0745) FiO2 (%):  [40 %-80 %] 40 % (02/22 0318) Weight:  [89.4 kg] 89.4 kg (02/22 0353)  Intake/Output from previous day: 02/21 0701 - 02/22 0700 In: 9722.7 [I.V.:6984.1; IV Piggyback:2738.6] Out: 1150 [Urine:1150] Intake/Output this shift: No intake/output data recorded. Nutritional status:  Diet Order             Diet NPO time specified  Diet effective now                   Neurologic Exam: Mental Status: Patient is somnolent but easily agitated from noxious stimuli. Patient not following commands. Was able to say "no" one time but otherwise nonverbal.  Speech is hoarse and limited CN: EOMI without ptosis.  Facial sensation appears symmetric Facial movement appears symmetric.  Tongue is midline without atrophy or fasciculations.  Motor: Tone is normal. Bulk is normal. Moving all extremities. Unable to assess strength due to not following commands. Sensation: Purposefully withdraws to noxious stimuli in all extremities Deep Tendon Reflexes: Unable to assess due to noncooperation Coordination: unable to assess due to noncooperation Gait - Deferred due to agitation  Lab Results: Results for orders placed or performed during the hospital encounter  of 04/10/21 (from the past 48 hour(s))  CBC with Differential     Status: Abnormal   Collection Time: 04/10/21 10:27 PM  Result Value Ref Range   WBC 17.0 (H) 4.0 - 10.5 K/uL   RBC 5.70 4.22 - 5.81 MIL/uL   Hemoglobin 15.5 13.0 - 17.0 g/dL   HCT 49.2 39.0 - 52.0 %   MCV 86.3 80.0 - 100.0 fL   MCH 27.2 26.0 - 34.0 pg   MCHC 31.5 30.0 - 36.0 g/dL   RDW 13.9 11.5 - 15.5 %   Platelets 292 150 - 400 K/uL   nRBC 0.0 0.0 - 0.2 %   Neutrophils Relative % 85 %   Neutro Abs 14.5 (H) 1.7 - 7.7 K/uL   Lymphocytes Relative 10 %   Lymphs Abs 1.7 0.7 - 4.0 K/uL   Monocytes Relative 4 %   Monocytes Absolute 0.6 0.1 - 1.0 K/uL   Eosinophils Relative 0 %   Eosinophils Absolute 0.0 0.0 - 0.5 K/uL   Basophils Relative 0 %   Basophils Absolute 0.0 0.0 - 0.1 K/uL   Immature Granulocytes 1 %   Abs Immature Granulocytes 0.14 (H) 0.00 - 0.07 K/uL    Comment: Performed at [facility], [address] [place], [place] [address]  Comprehensive metabolic panel     Status: Abnormal   Collection Time: 04/10/21 10:27 PM  Result Value Ref Range   Sodium 135 135 -  145 mmol/L   Potassium 4.4 3.5 - 5.1 mmol/L   Chloride 103 98 - 111 mmol/L   CO2 17 (L) 22 - 32 mmol/L   Glucose, Bld 254 (H) 70 - 99 mg/dL    Comment: Glucose reference range applies only to samples taken after fasting for at least 8 hours.   BUN 21 8 - 23 mg/dL   Creatinine, Ser 1.76 (H) 0.61 - 1.24 mg/dL   Calcium 9.0 8.9 - 10.3 mg/dL   Total Protein 6.4 (L) 6.5 - 8.1 g/dL   Albumin 3.8 3.5 - 5.0 g/dL   AST 22 15 - 41 U/L   ALT 18 0 - 44 U/L   Alkaline Phosphatase 65 38 - 126 U/L   Total Bilirubin 0.6 0.3 - 1.2 mg/dL   GFR, Estimated 40 (L) >60 mL/min    Comment: (NOTE) Calculated using the CKD-EPI Creatinine Equation (2021)    Anion gap 15 5 - 15    Comment: Performed at [facility] [address] [place], [place] [address]  Ammonia     Status: None   Collection Time: 04/10/21 10:27 PM  Result Value Ref Range    Ammonia 26 9 - 35 umol/L    Comment: Performed at [facility], [place] [address], [place], [place] [address]  Acetaminophen level     Status: None   Collection Time: 04/10/21 10:27 PM  Result Value Ref Range   Acetaminophen (Tylenol), Serum 10 10 - 30 ug/mL    Comment: (NOTE) Therapeutic concentrations vary significantly. A range of 10-30 ug/mL  may be an effective concentration for many patients. However, some  are best treated at concentrations outside of this range. Acetaminophen concentrations >150 ug/mL at 4 hours after ingestion  and >50 ug/mL at 12 hours after ingestion are often associated with  toxic reactions.  Performed at [facility], [place] [address] [place], [place] [address]   Salicylate level     Status: Abnormal   Collection Time: 04/10/21 10:27 PM  Result Value Ref Range   Salicylate Lvl <2.0 (L) 7.0 - 30.0 mg/dL    Comment: Performed at [facility] [address], [place], [place] [address]  Ethanol     Status: None   Collection Time: 04/10/21 10:27 PM  Result Value Ref Range   Alcohol, Ethyl (B) <10 <10 mg/dL    Comment: (NOTE) Lowest detectable limit for serum alcohol is 10 mg/dL.  For medical purposes only. Performed at [facility], [place] [address], [place], [place] [address]   Troponin I (High Sensitivity)     Status: None   Collection Time: 04/10/21 10:27 PM  Result Value Ref Range   Troponin I (High Sensitivity) 13 <18 ng/L    Comment: (NOTE) Elevated high sensitivity troponin I (hsTnI) values and significant  changes across serial measurements may suggest ACS but many other  chronic and acute conditions are known to elevate hsTnI results.  Refer to the "Links" section for chest pain algorithms and additional  guidance. Performed at [facility], [place] [address], [place], [place] [address]   Protime-INR     Status: None   Collection Time: 04/10/21 10:27 PM  Result Value Ref Range   Prothrombin Time 14.5 11.4 - 15.2  seconds   INR 1.1 0.8 - 1.2    Comment: (NOTE) INR goal varies based on device and disease states. Performed at [facility], [place] [address], [place], [place]  [address]   Resp Panel by RT-PCR (Flu A&B, Covid) Nasopharyngeal Swab     Status: None   Collection Time: 04/10/21 10:30 PM   Specimen: Nasopharyngeal Swab; Nasopharyngeal(NP) swabs in vial transport medium  Result Value Ref Range   SARS Coronavirus 2 by RT PCR NEGATIVE NEGATIVE    Comment: (NOTE) SARS-CoV-2 target nucleic acids are NOT DETECTED.  The SARS-CoV-2 RNA is generally detectable in upper respiratory specimens during the acute phase of infection. The lowest concentration of SARS-CoV-2 viral copies this assay can detect is 138 copies/mL. A negative result does not preclude SARS-Cov-2 infection and should not be used as the sole basis for treatment or other patient management decisions. A negative result may occur with  improper specimen collection/handling, submission of specimen other than nasopharyngeal swab, presence of viral mutation(s) within the areas targeted by this assay, and inadequate number of viral copies(<138 copies/mL). A negative result must be combined with clinical observations, patient history, and epidemiological information. The expected result is Negative.  Fact Sheet for Patients:  EntrepreneurPulse.com.au  Fact Sheet for Healthcare Providers:  IncredibleEmployment.be  This test is no t yet approved or cleared by the [place] FDA and  has been authorized for detection and/or diagnosis of SARS-CoV-2 by FDA under an Emergency Use Authorization (EUA). This EUA will remain  in effect (meaning this test can be used) for the duration of the COVID-19 declaration under Section 564(b)(1) of the Act, 21 U.S.C.section 360bbb-3(b)(1), unless the authorization is terminated  or revoked sooner.       Influenza A by PCR NEGATIVE NEGATIVE   Influenza B by  PCR NEGATIVE NEGATIVE    Comment: (NOTE) The Xpert Xpress SARS-CoV-2/FLU/RSV plus assay is intended as an aid in the diagnosis of influenza from Nasopharyngeal swab specimens and should not be used as a sole basis for treatment. Nasal washings and aspirates are unacceptable for Xpert Xpress SARS-CoV-2/FLU/RSV testing.  Fact Sheet for Patients: EntrepreneurPulse.com.au  Fact Sheet for Healthcare Providers: IncredibleEmployment.be  This test is not yet approved or cleared by the [place] FDA and has been authorized for detection and/or diagnosis of SARS-CoV-2 by FDA under an Emergency Use Authorization (EUA). This EUA will remain in effect (meaning this test can be used) for the duration of the COVID-19 declaration under Section 564(b)(1) of the Act, 21 U.S.C. section 360bbb-3(b)(1), unless the authorization is terminated or revoked.  Performed at [facility], [place] [address], [place], [place] [address]   I-stat chem 8, ED (not at [facility] or [facility])     Status: Abnormal   Collection Time: 04/10/21 10:30 PM  Result Value Ref Range   Sodium 137 135 - 145 mmol/L   Potassium 4.2 3.5 - 5.1 mmol/L   Chloride 106 98 - 111 mmol/L   BUN 22 8 - 23 mg/dL   Creatinine, Ser 1.80 (H) 0.61 - 1.24 mg/dL   Glucose, Bld 256 (H) 70 - 99 mg/dL    Comment: Glucose reference range applies only to samples taken after fasting for at least 8 hours.   Calcium, Ion 1.09 (L) 1.15 - 1.40 mmol/L   TCO2 19 (L) 22 - 32 mmol/L   Hemoglobin 17.0 13.0 - 17.0 g/dL   HCT 50.0 39.0 - 52.0 %  Lactic acid, plasma     Status: Abnormal   Collection Time: 04/10/21 10:36 PM  Result Value Ref Range   Lactic Acid, Venous 4.7 (HH) 0.5 - 1.9 mmol/L    Comment: CRITICAL RESULT CALLED TO, READ BACK  BY AND VERIFIED WITH: Diamantina Monks RN 04/10/21 2339 Wiliam Ke Performed at [facility], [place] [address], [place], [place] [address]   Type and screen [place]     Status: None   Collection Time: 04/10/21 10:40 PM  Result Value Ref Range   ABO/RH(D) A POS    Antibody Screen NEG    Sample Expiration      04/13/2021,2359 Performed at [facility], [place] [address], [place], [place] [address]   TSH     Status: None   Collection Time: 04/10/21 11:20 PM  Result Value Ref Range   TSH 2.785 0.350 - 4.500 uIU/mL    Comment: Performed by a 3rd Generation assay with a functional sensitivity of <=0.01 uIU/mL. Performed at [facility], [place] [address] [place], [place] [address]   ABO/Rh     Status: None   Collection Time: 04/10/21 11:20 PM  Result Value Ref Range   ABO/RH(D)      A POS Performed at [facility] [address], [place], [place] [address]   I-Stat arterial blood gas, ED     Status: Abnormal   Collection Time: 04/10/21 11:21 PM  Result Value Ref Range   pH, Arterial 7.260 (L) 7.35 - 7.45   pCO2 arterial 42.5 32 - 48 mmHg   pO2, Arterial 52 (L) 83 - 108 mmHg   Bicarbonate 19.3 (L) 20.0 - 28.0 mmol/L   TCO2 21 (L) 22 - 32 mmol/L   O2 Saturation 83 %   Acid-base deficit 8.0 (H) 0.0 - 2.0 mmol/L   Sodium 134 (L) 135 - 145 mmol/L   Potassium 4.5 3.5 - 5.1 mmol/L   Calcium, Ion 1.20 1.15 - 1.40 mmol/L   HCT 39.0 39.0 - 52.0 %   Hemoglobin 13.3 13.0 - 17.0 g/dL   Patient temperature 96.8 F    Collection site RADIAL, Kinley'S TEST ACCEPTABLE    Drawn by RT    Sample type ARTERIAL   Lactic acid, plasma     Status: Abnormal   Collection Time: 04/11/21 12:20 AM  Result Value Ref Range   Lactic Acid, Venous 3.5 (HH) 0.5 - 1.9 mmol/L    Comment: CRITICAL VALUE NOTED.  VALUE IS CONSISTENT WITH PREVIOUSLY REPORTED AND CALLED VALUE. Performed at [facility], [place] [address] [place], [place] [address]   Troponin I (High Sensitivity)     Status: None   Collection Time: 04/11/21 12:20 AM  Result Value Ref Range   Troponin I (High Sensitivity) 10 <18 ng/L    Comment: (NOTE) Elevated high sensitivity  troponin I (hsTnI) values and significant  changes across serial measurements may suggest ACS but many other  chronic and acute conditions are known to elevate hsTnI results.  Refer to the "Links" section for chest pain algorithms and additional  guidance. Performed at [facility], [place] [address] [place], [place] [address]   Hemoglobin A1c     Status: Abnormal   Collection Time: 04/11/21 12:20 AM  Result Value Ref Range   Hgb A1c MFr Bld 6.4 (H) 4.8 - 5.6 %    Comment: (NOTE)         Prediabetes: 5.7 - 6.4         Diabetes: >6.4         Glycemic control for adults with diabetes: <7.0    Mean Plasma Glucose 137 mg/dL    Comment: (NOTE) Performed At: [facility]  [place], [place] [id number] Rush Farmer MD IW:[id number]   Glucose, capillary     Status: Abnormal   Collection Time: 04/11/21  1:02 AM  Result Value Ref Range   Glucose-Capillary 215 (H) 70 - 99 mg/dL    Comment: Glucose reference range applies only to samples taken after fasting for at least 8 hours.  MRSA Next Gen by PCR, Nasal     Status: None   Collection Time: 04/11/21  1:35 AM   Specimen: Nasal Mucosa; Nasal Swab  Result Value Ref Range   MRSA by PCR Next Gen NOT DETECTED NOT DETECTED    Comment: (NOTE) The GeneXpert MRSA Assay (FDA approved for NASAL specimens only), is one component of a comprehensive MRSA colonization surveillance program. It is not intended to diagnose MRSA infection nor to guide or monitor treatment for MRSA infections. Test performance is not FDA approved in patients less than 61 years old. Performed at [facility], [place] [address] [place], [place] [address]   Glucose, capillary     Status: Abnormal   Collection Time: 04/11/21  3:20 AM  Result Value Ref Range   Glucose-Capillary 169 (H) 70 - 99 mg/dL    Comment: Glucose reference range applies only to samples taken after fasting for at least 8 hours.  I-STAT 7, (LYTES, BLD GAS, ICA, H+H)      Status: Abnormal   Collection Time: 04/11/21  4:12 AM  Result Value Ref Range   pH, Arterial 7.278 (L) 7.35 - 7.45   pCO2 arterial 36.8 32 - 48 mmHg   pO2, Arterial 88 83 - 108 mmHg   Bicarbonate 17.2 (L) 20.0 - 28.0 mmol/L   TCO2 18 (L) 22 - 32 mmol/L   O2 Saturation 96 %   Acid-base deficit 9.0 (H) 0.0 - 2.0 mmol/L   Sodium 140 135 - 145 mmol/L   Potassium 4.5 3.5 - 5.1 mmol/L   Calcium, Ion 1.20 1.15 - 1.40 mmol/L   HCT 42.0 39.0 - 52.0 %   Hemoglobin 14.3 13.0 - 17.0 g/dL   Patient temperature 98.6 F    Collection site RADIAL, [facility]areja'S TEST ACCEPTABLE    Drawn by RT    Sample type ARTERIAL   Triglycerides     Status: None   Collection Time: 04/11/21  4:20 AM  Result Value Ref Range   Triglycerides 39 <150 mg/dL    Comment: Performed at [facility], [place] [address], [place], [place] [address]  CBC     Status: Abnormal   Collection Time: 04/11/21  4:20 AM  Result Value Ref Range   WBC 1.2 (LL) 4.0 - 10.5 K/uL    Comment: REPEATED TO VERIFY THIS CRITICAL RESULT HAS VERIFIED AND BEEN CALLED TO T PRIDGEN RN BY CANDACE HAYES ON 02 21 2023 AT 0515, AND HAS BEEN READ BACK.     RBC 5.25 4.22 - 5.81 MIL/uL   Hemoglobin 14.5 13.0 - 17.0 g/dL   HCT 44.4 39.0 - 52.0 %   MCV 84.6 80.0 - 100.0 fL   MCH 27.6 26.0 - 34.0 pg   MCHC 32.7 30.0 - 36.0 g/dL   RDW 13.9 11.5 - 15.5 %   Platelets 210 150 - 400 K/uL   nRBC 0.0 0.0 - 0.2 %    Comment: Performed at [facility], [place] [address], [place], [place] [address]  Basic metabolic panel     Status: Abnormal   Collection Time: 04/11/21  4:20 AM  Result Value Ref Range   Sodium 138 135 - 145 mmol/L   Potassium 4.5 3.5 - 5.1 mmol/L   Chloride 108 98 - 111 mmol/L   CO2 20 (L) 22 - 32 mmol/L   Glucose, Bld 134 (H) 70 - 99 mg/dL    Comment: Glucose reference range applies only to samples taken after fasting for at least 8 hours.   BUN 23 8 - 23 mg/dL   Creatinine, Ser 2.04 (H) 0.61 - 1.24 mg/dL   Calcium 7.8 (L) 8.9 - 10.3  mg/dL   GFR, Estimated 33 (L) >60 mL/min    Comment: (NOTE) Calculated using the CKD-EPI Creatinine Equation (2021)    Anion gap 10 5 - 15    Comment: Performed at [facility] [address], [place], [place] [address]  Magnesium     Status: Abnormal   Collection Time: 04/11/21  4:20 AM  Result Value Ref Range   Magnesium 1.6 (L) 1.7 - 2.4 mg/dL    Comment: Performed at [facility] [address], [place], [place] [address]  Phosphorus     Status: None   Collection Time: 04/11/21  4:20 AM  Result Value Ref Range   Phosphorus 2.7 2.5 - 4.6 mg/dL    Comment: Performed at [facility] [address] [place], [place] [address]  Hemoglobin and hematocrit, blood     Status: None   Collection Time: 04/11/21  6:59 AM  Result Value Ref Range   Hemoglobin 14.2 13.0 - 17.0 g/dL   HCT 43.0 39.0 - 52.0 %    Comment: Performed at [facility], [place] [address], [place], [place] [address]  Basic metabolic panel     Status: Abnormal   Collection Time: 04/11/21  6:59 AM  Result Value Ref Range   Sodium 142 135 - 145 mmol/L   Potassium 4.0 3.5 - 5.1 mmol/L   Chloride 110 98 - 111 mmol/L   CO2 20 (L) 22 - 32 mmol/L   Glucose, Bld 102 (H) 70 - 99 mg/dL    Comment: Glucose reference range applies only to samples taken after fasting for at least 8 hours.   BUN 25 (H) 8 - 23 mg/dL   Creatinine, Ser 2.19 (H) 0.61 - 1.24 mg/dL   Calcium 7.6 (L) 8.9 - 10.3 mg/dL   GFR, Estimated 31 (L) >60 mL/min    Comment: (NOTE) Calculated using the CKD-EPI Creatinine Equation (2021)    Anion gap 12 5 - 15    Comment: Performed at [facility] [address], [place], [place] [address]  Magnesium     Status: Abnormal   Collection Time: 04/11/21  6:59 AM  Result Value Ref Range   Magnesium 1.5 (L) 1.7 - 2.4 mg/dL    Comment: Performed at [facility] [address] [place], [place] [address]  Glucose, capillary     Status: None   Collection Time: 04/11/21  7:29 AM   Result Value Ref Range   Glucose-Capillary 87 70 - 99 mg/dL    Comment: Glucose reference range applies only to samples taken after fasting for at least 8 hours.  I-STAT 7, (LYTES, BLD GAS, ICA, H+H)     Status: Abnormal   Collection Time: 04/11/21  9:20 AM  Result Value Ref Range   pH, Arterial 7.327 (L) 7.35 - 7.45   pCO2 arterial 41.1 32 - 48 mmHg   pO2, Arterial 73 (L) 83 - 108  mmHg   Bicarbonate 21.5 20.0 - 28.0 mmol/L   TCO2 23 22 - 32 mmol/L   O2 Saturation 93 %   Acid-base deficit 4.0 (H) 0.0 - 2.0 mmol/L   Sodium 142 135 - 145 mmol/L   Potassium 4.4 3.5 - 5.1 mmol/L   Calcium, Ion 1.19 1.15 - 1.40 mmol/L   HCT 39.0 39.0 - 52.0 %   Hemoglobin 13.3 13.0 - 17.0 g/dL   Patient temperature 98.3 F    Collection site RADIAL, [facility]ieken'S TEST ACCEPTABLE    Drawn by RT    Sample type ARTERIAL   Glucose, capillary     Status: Abnormal   Collection Time: 04/11/21 11:38 AM  Result Value Ref Range   Glucose-Capillary 105 (H) 70 - 99 mg/dL    Comment: Glucose reference range applies only to samples taken after fasting for at least 8 hours.  Urinalysis, Routine w reflex microscopic Urine, Catheterized     Status: Abnormal   Collection Time: 04/11/21 12:09 PM  Result Value Ref Range   Color, Urine YELLOW YELLOW   APPearance HAZY (A) CLEAR   Specific Gravity, Urine >1.046 (H) 1.005 - 1.030   pH 5.0 5.0 - 8.0   Glucose, UA 50 (A) NEGATIVE mg/dL   Hgb urine dipstick SMALL (A) NEGATIVE   Bilirubin Urine NEGATIVE NEGATIVE   Ketones, ur 20 (A) NEGATIVE mg/dL   Protein, ur 30 (A) NEGATIVE mg/dL   Nitrite NEGATIVE NEGATIVE   Leukocytes,Ua NEGATIVE NEGATIVE   RBC / HPF 21-50 0 - 5 RBC/hpf   WBC, UA 0-5 0 - 5 WBC/hpf   Bacteria, UA RARE (A) NONE SEEN   Mucus PRESENT    Hyaline Casts, UA PRESENT     Comment: Performed at [facility] [address], [place], [place] [address]  Rapid urine drug screen (hospital performed)     Status: Abnormal   Collection Time: 04/11/21 12:09 PM   Result Value Ref Range   Opiates NONE DETECTED NONE DETECTED   Cocaine NONE DETECTED NONE DETECTED   Benzodiazepines POSITIVE (A) NONE DETECTED   Amphetamines NONE DETECTED NONE DETECTED   Tetrahydrocannabinol NONE DETECTED NONE DETECTED   Barbiturates NONE DETECTED NONE DETECTED    Comment: (NOTE) DRUG SCREEN FOR MEDICAL PURPOSES ONLY.  IF CONFIRMATION IS NEEDED FOR ANY PURPOSE, NOTIFY LAB WITHIN 5 DAYS.  LOWEST DETECTABLE LIMITS FOR URINE DRUG SCREEN Drug Class                     Cutoff (ng/mL) Amphetamine and metabolites    1000 Barbiturate and metabolites    200 Benzodiazepine                 147 Tricyclics and metabolites     300 Opiates and metabolites        300 Cocaine and metabolites        300 THC                            50 Performed at [facility], [place] [address], [place], [place] [address]   Basic metabolic panel     Status: Abnormal   Collection Time: 04/11/21  2:02 PM  Result Value Ref Range   Sodium 140 135 - 145 mmol/L   Potassium 4.3 3.5 - 5.1 mmol/L   Chloride 101 98 - 111 mmol/L   CO2 30 22 - 32 mmol/L   Glucose, Bld 95 70 -  99 mg/dL    Comment: Glucose reference range applies only to samples taken after fasting for at least 8 hours.   BUN 23 8 - 23 mg/dL   Creatinine, Ser 1.91 (H) 0.61 - 1.24 mg/dL   Calcium 6.3 (LL) 8.9 - 10.3 mg/dL    Comment: CRITICAL RESULT CALLED TO, READ BACK BY AND VERIFIED WITH: CARMICHAEL J, RN ON 04/11/21 @ 1536 BY APPIAH D    GFR, Estimated 36 (L) >60 mL/min    Comment: (NOTE) Calculated using the CKD-EPI Creatinine Equation (2021)    Anion gap 9 5 - 15    Comment: Performed at [facility] [address] [place], [place] [address]  Magnesium     Status: Abnormal   Collection Time: 04/11/21  2:02 PM  Result Value Ref Range   Magnesium 1.6 (L) 1.7 - 2.4 mg/dL    Comment: Performed at [facility] [address] [place],  [address]  CBC with Differential/Platelet     Status: Abnormal    Collection Time: 04/11/21  2:02 PM  Result Value Ref Range   WBC 4.3 4.0 - 10.5 K/uL   RBC 4.15 (L) 4.22 - 5.81 MIL/uL   Hemoglobin 11.3 (L) 13.0 - 17.0 g/dL   HCT 34.7 (L) 39.0 - 52.0 %   MCV 83.6 80.0 - 100.0 fL   MCH 27.2 26.0 - 34.0 pg   MCHC 32.6 30.0 - 36.0 g/dL   RDW 14.0 11.5 - 15.5 %   Platelets 180 150 - 400 K/uL   nRBC 0.0 0.0 - 0.2 %   Neutrophils Relative % 88 %   Neutro Abs 3.8 1.7 - 7.7 K/uL   Lymphocytes Relative 6 %   Lymphs Abs 0.3 (L) 0.7 - 4.0 K/uL   Monocytes Relative 5 %   Monocytes Absolute 0.2 0.1 - 1.0 K/uL   Eosinophils Relative 0 %   Eosinophils Absolute 0.0 0.0 - 0.5 K/uL   Basophils Relative 0 %   Basophils Absolute 0.0 0.0 - 0.1 K/uL   WBC Morphology MORPHOLOGY UNREMARKABLE    RBC Morphology MORPHOLOGY UNREMARKABLE    Smear Review MORPHOLOGY UNREMARKABLE    Immature Granulocytes 1 %   Abs Immature Granulocytes 0.02 0.00 - 0.07 K/uL    Comment: Performed at [facility], [place] [address], [place], [place] [address]  Glucose, capillary     Status: None   Collection Time: 04/11/21  3:15 PM  Result Value Ref Range   Glucose-Capillary 99 70 - 99 mg/dL    Comment: Glucose reference range applies only to samples taken after fasting for at least 8 hours.  Glucose, capillary     Status: Abnormal   Collection Time: 04/11/21  8:14 PM  Result Value Ref Range   Glucose-Capillary 105 (H) 70 - 99 mg/dL    Comment: Glucose reference range applies only to samples taken after fasting for at least 8 hours.  Glucose, capillary     Status: Abnormal   Collection Time: 04/11/21 11:43 PM  Result Value Ref Range   Glucose-Capillary 120 (H) 70 - 99 mg/dL    Comment: Glucose reference range applies only to samples taken after fasting for at least 8 hours.  Glucose, capillary     Status: Abnormal   Collection Time: 04/12/21  3:24 AM  Result Value Ref Range   Glucose-Capillary 124 (H) 70 - 99 mg/dL    Comment: Glucose reference range applies only to samples  taken after fasting for at  least 8 hours.  CBC     Status: Abnormal   Collection Time: 04/12/21  5:31 AM  Result Value Ref Range   WBC 9.9 4.0 - 10.5 K/uL   RBC 4.29 4.22 - 5.81 MIL/uL   Hemoglobin 11.8 (L) 13.0 - 17.0 g/dL   HCT 36.2 (L) 39.0 - 52.0 %   MCV 84.4 80.0 - 100.0 fL   MCH 27.5 26.0 - 34.0 pg   MCHC 32.6 30.0 - 36.0 g/dL   RDW 14.6 11.5 - 15.5 %   Platelets 170 150 - 400 K/uL   nRBC 0.0 0.0 - 0.2 %    Comment: Performed at [facility], [place] [address] [place], [place] [address]  Basic metabolic panel     Status: Abnormal   Collection Time: 04/12/21  5:31 AM  Result Value Ref Range   Sodium 142 135 - 145 mmol/L   Potassium 4.5 3.5 - 5.1 mmol/L   Chloride 110 98 - 111 mmol/L   CO2 23 22 - 32 mmol/L   Glucose, Bld 123 (H) 70 - 99 mg/dL    Comment: Glucose reference range applies only to samples taken after fasting for at least 8 hours.   BUN 30 (H) 8 - 23 mg/dL   Creatinine, Ser 1.82 (H) 0.61 - 1.24 mg/dL   Calcium 8.1 (L) 8.9 - 10.3 mg/dL   GFR, Estimated 38 (L) >60 mL/min    Comment: (NOTE) Calculated using the CKD-EPI Creatinine Equation (2021)    Anion gap 9 5 - 15    Comment: Performed at [facility] [address] [place], [place] [address]  Glucose, capillary     Status: Abnormal   Collection Time: 04/12/21  7:47 AM  Result Value Ref Range   Glucose-Capillary 102 (H) 70 - 99 mg/dL    Comment: Glucose reference range applies only to samples taken after fasting for at least 8 hours.    Recent Results (from the past 240 hour(s))  Resp Panel by RT-PCR (Flu A&B, Covid) Nasopharyngeal Swab     Status: None   Collection Time: 04/10/21 10:30 PM   Specimen: Nasopharyngeal Swab; Nasopharyngeal(NP) swabs in vial transport medium  Result Value Ref Range Status   SARS Coronavirus 2 by RT PCR NEGATIVE NEGATIVE Final    Comment: (NOTE) SARS-CoV-2 target nucleic acids are NOT DETECTED.  The SARS-CoV-2 RNA is generally detectable in upper  respiratory specimens during the acute phase of infection. The lowest concentration of SARS-CoV-2 viral copies this assay can detect is 138 copies/mL. A negative result does not preclude SARS-Cov-2 infection and should not be used as the sole basis for treatment or other patient management decisions. A negative result may occur with  improper specimen collection/handling, submission of specimen other than nasopharyngeal swab, presence of viral mutation(s) within the areas targeted by this assay, and inadequate number of viral copies(<138 copies/mL). A negative result must be combined with clinical observations, patient history, and epidemiological information. The expected result is Negative.  Fact Sheet for Patients:  EntrepreneurPulse.com.au  Fact Sheet for Healthcare Providers:  IncredibleEmployment.be  This test is no t yet approved or cleared by the [place] FDA and  has been authorized for detection and/or diagnosis of SARS-CoV-2 by FDA under an Emergency Use Authorization (EUA). This EUA will remain  in effect (meaning this test can be used) for the duration of the COVID-19 declaration under Section 564(b)(1) of the Act, 21 U.S.C.section 360bbb-3(b)(1), unless the authorization is terminated  or revoked  sooner.       Influenza A by PCR NEGATIVE NEGATIVE Final   Influenza B by PCR NEGATIVE NEGATIVE Final    Comment: (NOTE) The Xpert Xpress SARS-CoV-2/FLU/RSV plus assay is intended as an aid in the diagnosis of influenza from Nasopharyngeal swab specimens and should not be used as a sole basis for treatment. Nasal washings and aspirates are unacceptable for Xpert Xpress SARS-CoV-2/FLU/RSV testing.  Fact Sheet for Patients: EntrepreneurPulse.com.au  Fact Sheet for Healthcare Providers: IncredibleEmployment.be  This test is not yet approved or cleared by the [place] FDA and has been  authorized for detection and/or diagnosis of SARS-CoV-2 by FDA under an Emergency Use Authorization (EUA). This EUA will remain in effect (meaning this test can be used) for the duration of the COVID-19 declaration under Section 564(b)(1) of the Act, 21 U.S.C. section 360bbb-3(b)(1), unless the authorization is terminated or revoked.  Performed at [facility], [place] [address] [place], [place] [address]   MRSA Next Gen by PCR, Nasal     Status: None   Collection Time: 04/11/21  1:35 AM   Specimen: Nasal Mucosa; Nasal Swab  Result Value Ref Range Status   MRSA by PCR Next Gen NOT DETECTED NOT DETECTED Final    Comment: (NOTE) The GeneXpert MRSA Assay (FDA approved for NASAL specimens only), is one component of a comprehensive MRSA colonization surveillance program. It is not intended to diagnose MRSA infection nor to guide or monitor treatment for MRSA infections. Test performance is not FDA approved in patients less than 78 years old. Performed at [facility], [place] [address], [place], [place] [address]     Lipid Panel Recent Labs    04/11/21 0420  TRIG 39    Studies/Results: CT ANGIO HEAD NECK W WO CM  Result Date: 04/11/2021 CLINICAL DATA:  Found unresponsive EXAM: CT ANGIOGRAPHY HEAD AND NECK TECHNIQUE: Multidetector CT imaging of the head and neck was performed using the standard protocol during bolus administration of intravenous contrast. Multiplanar CT image reconstructions and MIPs were obtained to evaluate the vascular anatomy. Carotid stenosis measurements (when applicable) are obtained utilizing NASCET criteria, using the distal internal carotid diameter as the denominator. RADIATION DOSE REDUCTION: This exam was performed according to the departmental dose-optimization program which includes automated exposure control, adjustment of the mA and/or kV according to patient size and/or use of iterative reconstruction technique. CONTRAST:  178m OMNIPAQUE  IOHEXOL 350 MG/ML SOLN COMPARISON:  No prior CTA, correlation is made with 04/10/2021 CT head FINDINGS: CT HEAD FINDINGS For noncontrast findings, please see same day CT head. CTA NECK FINDINGS Aortic arch: Standard branching. Imaged portion shows no evidence of aneurysm or dissection. No significant stenosis of the major arch vessel origins. Right carotid system: No evidence of dissection, stenosis (50% or greater) or occlusion. Left carotid system: No evidence of dissection, stenosis (50% or greater) or occlusion. Vertebral arteries: No evidence of dissection, stenosis (50% or greater) or occlusion. 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 Evaluation of the right V3 is limited by beam hardening artifact but it is favored to be patent. The left vertebral artery is patent from its origin to the skull base. Skeleton: No acute osseous abnormality. Other neck: Endotracheal and orogastric tubes noted. Upper chest: Please see same-day CT chest. Review of the MIP images confirms the above findings CTA HEAD FINDINGS Anterior circulation: Both internal carotid arteries are patent to  the termini, without significant stenosis. A1 segments patent, with a relatively diminutive left A1. Normal anterior communicating artery. Anterior cerebral arteries are patent to their distal aspects. No M1 stenosis or occlusion. Normal MCA bifurcations. Distal MCA branches perfused and symmetric. Posterior circulation: The right vertebral artery appears to terminate in PICA, although evaluation is somewhat limited by diminutive size and beam hardening artifact. The left vertebral artery is patent to the vertebrobasilar junction. Posterior inferior cerebral arteries patent bilaterally. Basilar patent to its distal aspect. Superior cerebellar arteries patent bilaterally. Patent P1 segments. PCAs perfused to their distal aspects without stenosis. The  bilateral posterior communicating arteries are not visualized. Venous sinuses: As permitted by contrast timing, patent. Anatomic variants: None significant Review of the MIP images confirms the above findings IMPRESSION: 1.  No intracranial large vessel occlusion or significant stenosis. 2. No hemodynamically significant stenosis in the neck. The right vertebral artery appears congenitally diminutive, given the size of the vertebral artery foramen. 3. For findings in the lung apices, please see same-day CT chest. Electronically Signed   By: Merilyn Baba M.D.   On: 04/11/2021 01:06   CT HEAD WO CONTRAST (5MM)  Result Date: 04/10/2021 CLINICAL DATA:  Patient found unresponsive. EXAM: CT HEAD WITHOUT CONTRAST TECHNIQUE: Contiguous axial images were obtained from the base of the skull through the vertex without intravenous contrast. RADIATION DOSE REDUCTION: This exam was performed according to the departmental dose-optimization program which includes automated exposure control, adjustment of the mA and/or kV according to patient size and/or use of iterative reconstruction technique. COMPARISON:  None. FINDINGS: Brain: There is mild cerebral atrophy with widening of the extra-axial spaces and ventricular dilatation. There are areas of decreased attenuation within the white matter tracts of the supratentorial brain, consistent with microvascular disease changes. 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 Vascular: No hyperdense vessel or unexpected calcification. Skull: Normal. Negative for fracture or focal lesion. Sinuses/Orbits: There is moderate severity posterior left maxillary sinus, bilateral ethmoid sinus and bilateral nasal mucosal thickening. Mild sphenoid sinus and posterior right maxillary sinus mucosal thickening is also seen. Other: Endotracheal and enteric tubes are seen. IMPRESSION: 1. No acute intracranial abnormality. 2. Mild cerebral atrophy with widening of the  extra-axial spaces and ventricular dilatation. 3. 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 4. Moderate severity paranasal sinus disease. Electronically Signed   By: Virgina Norfolk M.D.   On: 04/10/2021 23:38   CT Cervical Spine Wo Contrast  Result Date: 04/10/2021 CLINICAL DATA:  Found unresponsive. EXAM: CT CERVICAL SPINE WITHOUT CONTRAST TECHNIQUE: Multidetector CT imaging of the cervical spine was performed without intravenous contrast. Multiplanar CT image reconstructions were also generated. RADIATION DOSE REDUCTION: This exam was performed according to the departmental dose-optimization program which includes automated exposure control, adjustment of the mA and/or kV according to patient size and/or use of iterative reconstruction technique. COMPARISON:  None. FINDINGS: Alignment: Normal. Skull base and vertebrae: No acute fracture. Chronic and degenerative changes seen involving the body and tip of the dens. No primary bone lesion or focal pathologic process. Soft tissues and spinal canal: No prevertebral fluid or swelling. No visible canal hematoma. Disc levels: Marked severity endplate sclerosis and mild associated osteophyte formation are seen at the levels of C5-C6 and C6-C7. There is marked severity narrowing of the anterior atlantoaxial articulation. Moderate to marked severity intervertebral disc space narrowing is seen at C5-C6 and C6-C7. Bilateral moderate to marked severity multilevel facet joint hypertrophy is noted. Upper chest: Mild atelectatic changes  are seen along the posterior aspect of the right apex. Other: Endotracheal and enteric tubes are in place. IMPRESSION: 1. No acute fracture or subluxation of the cervical spine. 2. Marked severity degenerative changes at the levels of C5-C6 and C6-C7. Electronically Signed   By: Virgina Norfolk M.D.   On: 04/10/2021 23:41   CT T-SPINE NO CHARGE  Result Date: 04/10/2021 CLINICAL DATA:  Unresponsive abnormal posture EXAM: CT  Thoracic and Lumbar spine without contrast TECHNIQUE: Multiplanar CT images of the thoracic and lumbar spine were reconstructed from contemporary CT of the Chest, Abdomen, and Pelvis. RADIATION DOSE REDUCTION: This exam was performed according to the departmental dose-optimization program which includes automated exposure control, adjustment of the mA and/or kV according to patient size and/or use of iterative reconstruction technique. CONTRAST:  None or No additional COMPARISON:  04/10/2021 FINDINGS: CT THORACIC SPINE FINDINGS Alignment: Normal. Vertebrae: No acute fracture or focal pathologic process. Probable small hemangioma at T7. Paraspinal and other soft tissues: See separately dictated CT angiography report. Disc levels: Disc spaces are patent. CT LUMBAR SPINE FINDINGS Segmentation: 5 lumbar type vertebrae. Alignment: Normal. Vertebrae: No acute fracture or focal pathologic process. Paraspinal and other soft tissues: Dictated separately. Disc levels: Disc spaces are patent. No high-grade canal stenosis. Mild disc bulge at L4-L5. Hypertrophic facet degenerative changes at L4-L5 and L5-S1. IMPRESSION: 1. No CT evidence for acute osseous abnormality of the thoracic or lumbar spine 2. 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 Mild degenerative changes at L4-L5 and L5-S1 Electronically Signed   By: Donavan Foil M.D.   On: 04/10/2021 23:57   CT L-SPINE NO CHARGE  Result Date: 04/10/2021 CLINICAL DATA:  Unresponsive abnormal posture EXAM: CT Thoracic and Lumbar spine without contrast TECHNIQUE: Multiplanar CT images of the thoracic and lumbar spine were reconstructed from contemporary CT of the Chest, Abdomen, and Pelvis. RADIATION DOSE REDUCTION: This exam was performed according to the departmental dose-optimization program which includes automated exposure control, adjustment of the mA and/or kV according to patient size and/or use of iterative reconstruction technique. CONTRAST:  None or No additional COMPARISON:   04/10/2021 FINDINGS: CT THORACIC SPINE FINDINGS Alignment: Normal. Vertebrae: No acute fracture or focal pathologic process. Probable small hemangioma at T7. Paraspinal and other soft tissues: See separately dictated CT angiography report. Disc levels: Disc spaces are patent. CT LUMBAR SPINE FINDINGS Segmentation: 5 lumbar type vertebrae. Alignment: Normal. Vertebrae: No acute fracture or focal pathologic process. Paraspinal and other soft tissues: Dictated separately. Disc levels: Disc spaces are patent. No high-grade canal stenosis. Mild disc bulge at L4-L5. Hypertrophic facet degenerative changes at L4-L5 and L5-S1. IMPRESSION: 1. No CT evidence for acute osseous abnormality of the thoracic or lumbar spine 2. 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 Mild degenerative changes at L4-L5 and L5-S1 Electronically Signed   By: Donavan Foil M.D.   On: 04/10/2021 23:57   DG Chest Portable 1 View  Result Date: 04/10/2021 CLINICAL DATA:  Intubated EXAM: PORTABLE CHEST 1 VIEW COMPARISON:  None. FINDINGS: Endotracheal tube tip is about 2.6 cm superior to carina. Esophageal tube tip below the diaphragm but incompletely visualized. Mildly low lung volumes. Left lung is grossly clear. Irregular right hilar opacity. No pleural effusion or pneumothorax IMPRESSION: 1. Endotracheal tube tip about 2.6 cm superior to carina. 2. Irregular right hilar opacity could be due to low lung volume and vascular crowding however lung mass is also considered. Recommend further evaluation with chest CT. Electronically Signed   By: Donavan Foil M.D.   On:  04/10/2021 22:37   EEG adult  Result Date: 04/11/2021 Lora Havens, MD     04/11/2021  8:42 AM Patient Name: Christopher Francis MRN: [id number] Epilepsy Attending: Lora Havens Referring Physician/Provider: Amie Portland, MD Date: 04/10/2021 Duration: 22.25 mins Patient history: 76 year old male found unresponsive.  EMS was called and reported 2 seizures for which she received Versed.  EEG evaluate  for seizure Level of alertness: lethargic AEDs during EEG study: LEV Technical aspects: This EEG study was done with scalp electrodes positioned according to the 10-20 International system of electrode placement. Electrical activity was acquired at a sampling rate of 500Hz  and reviewed with a high frequency filter of 70Hz  and a low frequency filter of 1Hz . EEG data were recorded continuously and digitally stored. Description: EEG showed continuous generalized low amplitude mixed frequencies with predominantly 6 to 9 Hz theta-alpha activity admixed with intermittent generalized 2 to 3 Hz delta slowing.  Hyperventilation and photic stimulation were not performed.   ABNORMALITY - Continuous slow, generalized IMPRESSION: This study is suggestive of moderate diffuse encephalopathy, nonspecific etiology. No seizures or epileptiform discharges were seen throughout the recording. Lora Havens   CT Angio Chest/Abd/Pel for Dissection W and/or W/WO  Result Date: 04/10/2021 CLINICAL DATA:  Polytrauma, blunt FALL, FOUND DOWN EXAM: CT ANGIOGRAPHY CHEST, ABDOMEN AND PELVIS TECHNIQUE: Non-contrast CT of the chest was initially obtained. Multidetector CT imaging through the chest, abdomen and pelvis was performed using the standard protocol during bolus administration of intravenous contrast. Multiplanar reconstructed images and MIPs were obtained and reviewed to evaluate the vascular anatomy. RADIATION DOSE REDUCTION: This exam was performed according to the departmental dose-optimization program which includes automated exposure control, adjustment of the mA and/or kV according to patient size and/or use of iterative reconstruction technique. CONTRAST:  174m OMNIPAQUE IOHEXOL 350 MG/ML SOLN COMPARISON:  Radiograph earlier today FINDINGS: CTA CHEST FINDINGS Cardiovascular: No aortic dissection, aneurysm, acute aortic syndrome, or aortic hematoma noncontrast exam. Mild atherosclerosis. Conventional branching pattern from  the aortic arch. Exam not tailored for pulmonary artery evaluation, no obvious central pulmonary embolus. Coronary artery calcifications. Heart is normal in size no pericardial effusion. Mediastinum/Nodes: Endotracheal tube tip above the carina. Enteric tube is in place, despite decompression there is fluid in the esophagus. Scattered small mediastinal lymph nodes not enlarged by size criteria. No suspicious thyroid nodule. Lungs/Pleura: There is complete filling of the right bronchus intermedius with segmental occlusion of the right middle and lower lobe bronchi. Right upper lobe and left-sided bronchial thickening with areas of mucous plugging. Multifocal tree-in-bud ground-glass nodularity within both lungs, perihilar and dependent predominant. No pneumothorax or pleural effusion. Incidental azygous fissure. Musculoskeletal: No acute fractures. Incidental T7 vertebral body hemangioma. No chest wall soft tissue abnormalities. Review of the MIP images confirms the above findings. CTA ABDOMEN AND PELVIS FINDINGS VASCULAR Aorta: Moderate atherosclerosis. Normal caliber aorta without aneurysm, dissection, vasculitis or significant stenosis. Celiac: Patent without evidence of aneurysm, dissection, vasculitis or significant stenosis. SMA: Patent without evidence of aneurysm, dissection, vasculitis or significant stenosis. Renals: Both renal arteries are patent without evidence of aneurysm, dissection, vasculitis, fibromuscular dysplasia or significant stenosis. IMA: Patent without evidence of aneurysm, dissection, vasculitis or significant stenosis. Inflow: Patent without evidence of aneurysm, dissection, vasculitis or significant stenosis. Veins: No obvious venous abnormality within the limitations of this arterial phase study. There is a circumaortic left renal vein. Review of the MIP images confirms the above findings. NON-VASCULAR Hepatobiliary: No focal hepatic abnormality on this arterial phase exam. Multiple  gallstones.  No gallbladder inflammation or biliary dilatation. Pancreas: No ductal dilatation or inflammation. Spleen: Normal in size and arterial enhancement. Adrenals/Urinary Tract: No adrenal nodule. No hydronephrosis. Homogeneous renal enhancement. No visualized renal calculi. Only minimally distended urinary bladder. Stomach/Bowel: Tip of the enteric tube within the stomach. There is fluid distending the distal esophagus. Fluid within the stomach. Small bowel are fluid-filled and mildly prominent, no discrete transition point to suggest obstruction. There is no small bowel pneumatosis or obvious small bowel inflammation. Fluid/liquid stool in the right, at descending and sigmoid colon. Suspected appendectomy with surgical sutures at the base of the cecum. Scattered colonic diverticulosis. There is no diverticulitis or colonic wall thickening. No colonic pneumatosis. Lymphatic: No abdominopelvic adenopathy. Calcification in the right upper abdomen posterior to the right lobe of the liver may represent calcified lymph known in sequela of prior granulomatous disease. Reproductive: Enlarged prostate gland spans 5.6 cm transverse. Other: No free air or abdominopelvic ascites. There is minimal fat in the inguinal canals. Musculoskeletal: No acute fracture. No abdominal wall soft tissue abnormalities. Review of the MIP images confirms the above findings. IMPRESSION: 1. No aortic dissection or acute aortic findings. Mild atherosclerosis. 2. No evidence of acute traumatic injury to the chest, abdomen, or pelvis. 3. Complete bronchial filling of bronchus intermedius. Additional diffuse bronchial thickening and areas mucous plugging. Multifocal tree-in-bud ground-glass nodularity within both lungs, perihilar and dependent predominant. Fluid distending the distal esophagus. Constellation of findings suspicious for aspiration. 4. Fluid-filled and mildly prominent small bowel, no discrete transition point to suggest  obstruction. Findings may represent enteritis, ileus, or slow transit. There is also liquid stool in the colon suggesting diarrheal process. 5. Colonic diverticulosis without acute diverticulitis. 6. Cholelithiasis without acute cholecystitis. 7. Enlarged prostate gland. Aortic Atherosclerosis (ICD10-I70.0). Electronically Signed   By: Keith Rake M.D.   On: 04/10/2021 23:52    Medications: Scheduled:  chlorhexidine gluconate (MEDLINE KIT)  15 mL Mouth Rinse BID   Chlorhexidine Gluconate Cloth  6 each Topical Daily   insulin aspart  0-15 Units Subcutaneous Q4H   mouth rinse  15 mL Mouth Rinse 10 times per day   [START ON 04/14/2021] pantoprazole  40 mg Intravenous Q12H   Continuous:  sodium chloride     fentaNYL infusion INTRAVENOUS 175 mcg/hr (04/12/21 0700)   lactated ringers 75 mL/hr at 04/12/21 0700   levETIRAcetam Stopped (04/11/21 2156)   pantoprazole 8 mg/hr (04/12/21 0700)   phenylephrine (NEO-SYNEPHRINE) Adult infusion 110 mcg/min (04/12/21 0716)   piperacillin-tazobactam (ZOSYN)  IV 12.5 mL/hr at 04/12/21 0700   propofol (DIPRIVAN) infusion 10 mcg/kg/min (04/12/21 0700)    sodium bicarbonate (isotonic) infusion in sterile water 50 mL/hr at 04/12/21 0700    Assessment: 76 year old male with a PMHx of hypertension brought in on Monday night after he was found unresponsive right next to the toilet seat. He was noted to be unresponsive after event in the bathroom which was not directly witnessed; his wife had heard a thud. Last known well sometime yesterday when he started to have some diarrhea and other GI symptoms and remained isolated from family and slept multiple hours on the day of admission which is unlike him. EMS witnessed what they described as 2 seizures for which she was given IV Versed. On arrival to the ED he was not responsive and questionably posturing. He was emergently intubated-paralytics were used.  He had significant amount of dark secretions from his OG tube  concerning for a GI bleed but no reported GI bleed per family.  Loaded with Keppra 1 g IV in the ED and was started on scheduled dosing. - No meningitic signs at time of ED assessment. He did have some leukocytosis which is now resolved. - Spot EEG negative for seizures.  Generalized slowing only.  - CTA of head and neck: No intracranial large vessel occlusion or significant stenosis. No hemodynamically significant stenosis in the neck. The right vertebral artery appears congenitally iminutive, given the size of the vertebral artery foramen. - UDS was negative except for benzodiazepines. EtOH < 10. Salicylate < 7. - CT-head no acute changes.  Small area of right parietal encephalomalacia which was also mentioned on the 2014 CT head report in care everywhere. - CTA chest abdomen pelvis with no evidence of aortic dissection. There are findings suspicious for aspiration.  - DDx: Broad differential of his presentation which does include seizure with postictal state versus stroke versus toxic metabolic encephalopathy.   Recommendations: - Continue Keppra 500 twice daily. - Seizure precautions - Continue to look for other causes of his current presentation. - Due to the aspiration pneumonia, might have been hypoxic for a while-MRI would be helpful to evaluate for any structural evidence for hypoxic brain injury.   LOS: 1 day   @Electronically  signed: Dr. Kerney Elbe 04/12/2021  8:02 AM

## 2021-04-12 NOTE — Procedures (Signed)
Extubation Procedure Note  Patient Details:   Name: Christopher Francis DOB: 01-19-1946 MRN: [id number]   Airway Documentation:    Vent end date: 04/12/21 Vent end time: 1102   Evaluation  O2 sats: stable throughout Complications: No apparent complications Patient did tolerate procedure well. Bilateral Breath Sounds: Clear, Diminished  Audible cuff leak was heard prior extubation no signs of stridor at the moment.. Pt is agitated and is currently not talking. Pt is currently stable at 4L [name] and will continue to monitor. No  Felecia Jan 04/12/2021, 11:03 AM

## 2021-04-12 NOTE — Progress Notes (Signed)
NAME:  Leonardo Makris, MRN:  [id number], DOB:  1945-07-19, LOS: 1 ADMISSION DATE:  04/10/2021, CONSULTATION DATE:  04/11/21 REFERRING MD:  Billy Fischer, CHIEF COMPLAINT:  AMS, seizures   History of Present Illness:  Christopher Francis is 76 yo M with past medical history of chest pain and seizures.  He presented to [facility] ED 2/20 with AMS.  He had GI symptoms that started 24 hour prior to presentation and he was noted to sleep 5-6 hrs during day which is unusual for him.  His wife stated that he got up to go to bathroom and she heard a thud then found him unresponsive in bathroom. EMS called and patient noted to have 2 seizures during transport.  He was intubated on arrival to ED and OG tube place with large amount of dark coffee-ground like output, 750 cc.  Seen by neuro in ED. Loaded with Keppra and EEG started.  Pertinent  Medical History  Chest pain Seizures  Significant Hospital Events: Including procedures, antibiotic start and stop dates in addition to other pertinent events   2/20 found unresponsive on floor at home, intubated 2/21 intubated, sedate, pressors needed 2/22 extubated  Interim History / Subjective:  Patient assessed at bedside this AM. He remains intubated, but able to follow commands, stick out tongue, and lift head off of pillow. No further episodes of diarrhea.  Objective   Blood pressure (!) 149/60, pulse (!) 57, temperature 98.6 F (37 C), temperature source Oral, resp. rate 20, height 5' 8.5" (1.74 m), weight 89.4 kg, SpO2 100 %.    Vent Mode: PRVC FiO2 (%):  [40 %-80 %] 40 % Set Rate:  [20 bmp] 20 bmp Vt Set:  [580 mL] 580 mL PEEP:  [8 cmH20] 8 cmH20 Plateau Pressure:  [18 cmH20-21 cmH20] 18 cmH20   Intake/Output Summary (Last 24 hours) at 04/12/2021 8453 Last data filed at 04/12/2021 0700 Gross per 24 hour  Intake 9722.67 ml  Output 1150 ml  Net 8572.67 ml    Filed Weights   04/11/21 0016 04/11/21 0500 04/12/21 0353  Weight: 81.5 kg 84.8 kg 89.4 kg    Examination: General:  In bed on vent, awake in no acute distress  HENT: NCAT ETT in place PULM: CTAB, vent supported breathing CV: RRR, no mgr GI: BS+, soft, nontender, small dark output in OG tube, none in canister MSK: normal bulk and tone Neuro: awake, follows commands  PRVC/ 20/580/8/40 Labs/imaging personally reviewed: Glucose 100s Creatinine 1.91-> 1.82 BUN 23->30 GFR 36-> 38 Ca 6.3-> 8.1 WBC 4.3-> 9.9 Hgb 11.3-> 11.8 C diff pending GI panel pending UA with proteinuria, ketonuria, hematuria MRI pending CXR pending OG aspirate  Resolved Hospital Problem list   NAGMA Metabolic acidosis  Assessment & Plan:  Acute metabolic encephalopathy Possible Seizure  Patient is awake and following commands this am. Will continue to decrease sedation. Patient passed SBT this AM, will extubate. Unable to complete MRI yesterday due to drip requirements.  -Neurology consulted and following - Neuro following, appreciate the assistance. - AED's per neuro. - MRI brain this PM if doing well following extubation  Acute hypoxic respiratory failure Aspiration, mucous plugging on imaging  Leukocytosis CT chest showed multifocal ground glass nodularity bilaterally.  Dark ground output from OG tube also in filter from ETT tube. WBC elevated at 17 with neutrophil predominance initially. Repeat WBC this AM 1.2, will recheck this PM. - Maintain full vent support with SAT/SBT as tolerated - titrate Vent setting to maintain SpO2 greater than or equal  to 90%. - HOB elevated 30 degrees. - Plateau pressures less than 30 cm H20.  - Follow chest x-ray, ABG prn.   - Bronchial hygiene and RT/bronchodilator protocol. - levophed to keep MAP >65 - Empiric Zosyn - repeat CBC this PM  Coffee ground output from OGT, concern for GI bleed History of diarrhea Hgb remains stable at 11.8 today. BUN trending up to 30 today. Small amount of output in OG tube today. No further episodes of diarrhea. -GI  following, no indications for EGD at this time -f/u Gi path panel, C diff panel - Hemoccult on OGT output -trend CBC, BMP -continue PPI BID  AKI Urinary retention Baseline creatinine 1.1.  Creatinine improving to 1.91 with max of 2.19 2/21. Patient with history of diarrheal state for prior 24 hours. He appears euvolemic after bolus yesterday. UA showed proteinuria, ketonuria, hematuria.  - trend bmp - holding home medication ACEI- HCTZ combo - strict I and Os  Hyperglycemia Pre-diabetes Last A1c 7/22 at 6.7 -SSI moderate q 4 hrs  Best Practice (right click and "Reselect all SmartList Selections" daily)   Diet/type: NPO DVT prophylaxis: other GI prophylaxis: PPI Lines: N/A Foley:  N/A Code Status:  full code Last date of multidisciplinary goals of care discussion [pending]  Elynn Patteson M. Penn Gr[place]ett, D.O.  Internal Medicine Resident, PGY-1 [facility]  Pager: [phone] 8:21 AM, 04/12/2021

## 2021-04-12 NOTE — Progress Notes (Signed)
[place] Progress Note Patient Name: Christopher Francis DOB: 1945/05/12 MRN: [id number]   Date of Service  04/12/2021  HPI/Events of Note  Nursing reports that patient has passed swallowing test and request clear liquid diet.   eICU Interventions  Plan: Will order Carb modified Clear Liquid Diet.      Intervention Category Major Interventions: Other:  Lysle Dingwall 04/12/2021, 8:25 PM

## 2021-04-12 NOTE — Telephone Encounter (Signed)
-----   Message from Jackquline Denmark, MD sent at 04/12/2021 12:14 PM EST ----- Regarding: FU Hi Christopher Francis Needs FU with me in 6 to 8 weeks RG

## 2021-04-12 NOTE — Progress Notes (Addendum)
Patient ID: Christopher Francis, male   DOB: 1945/06/09, 76 y.o.   MRN: [id number]    Progress Note   Subjective   Day # 2  CC; abnormal CT/coffee-ground material in OG tube  Remains intubated, Tmax 100.8  Family at bedside, patient is being weaned from vent, and pressors  No significant OG output, no melena or stools since admission  Discussion with wife today-patient has had 4 similar distinct episodes over the past 12 years with acute onset of abdominal pain which has become severe, diarrhea and then syncope.  The last episode I believe was 2018.  No precipitating factors that they are aware of.  With previous episodes the syncope was brief.  He is fine in between these episodes from a GI perspective. Only regular med is lisinopril.  Zosyn  Chest x-ray today-OG tube with sideport in the distal esophagus distal tip at GE junction should be advanced 6 to 7 cm-bilateral opacities left worse than right concerning for bilateral pneumonia  WBC 9.9/hemoglobin 11.8/hematocrit 36.2 (down from 13.3 yesterday) BUN 30/creatinine 1.8  GI path panel/C. difficile quick screen pending-not collected as yet, no stools  Objective   Vital signs in last 24 hours: Temp:  [98.6 F (37 C)-100.8 F (38.2 C)] 99.4 F (37.4 C) (02/22 0800) Pulse Rate:  [49-88] 61 (02/22 0915) Resp:  [0-24] 15 (02/22 0915) BP: (65-156)/(38-100) 132/46 (02/22 0915) SpO2:  [92 %-100 %] 100 % (02/22 0915) FiO2 (%):  [40 %-70 %] 40 % (02/22 0906) Weight:  [89.4 kg] 89.4 kg (02/22 0353) Last BM Date :  (PTA) General:    Older white male on vent, starting to wake, intermittently gagging Heart:  Regular rate and rhythm; no murmurs Lungs: Coarse breath sounds bilaterally Abdomen:  Soft, nontender and nondistended. Normal bowel sounds. Extremities:  Without edema. Neurologic: Coming off sedation Psych:   Intake/Output from previous day: 02/21 0701 - 02/22 0700 In: 9722.7 [I.V.:6984.1; IV Piggyback:2738.6] Out: 1150  [Urine:1150] Intake/Output this shift: Total I/O In: 471.5 [I.V.:446.4; IV Piggyback:25.1] Out: -   Lab Results: Recent Labs    04/11/21 0420 04/11/21 0659 04/11/21 0920 04/11/21 1402 04/12/21 0531  WBC 1.2*  --   --  4.3 9.9  HGB 14.5   < > 13.3 11.3* 11.8*  HCT 44.4   < > 39.0 34.7* 36.2*  PLT 210  --   --  180 170   < > = values in this interval not displayed.   BMET Recent Labs    04/11/21 0659 04/11/21 0920 04/11/21 1402 04/12/21 0531  NA 142 142 140 142  K 4.0 4.4 4.3 4.5  CL 110  --  101 110  CO2 20*  --  30 23  GLUCOSE 102*  --  95 123*  BUN 25*  --  23 30*  CREATININE 2.19*  --  1.91* 1.82*  CALCIUM 7.6*  --  6.3* 8.1*   LFT Recent Labs    04/10/21 2227  PROT 6.4*  ALBUMIN 3.8  AST 22  ALT 18  ALKPHOS 65  BILITOT 0.6   PT/INR Recent Labs    04/10/21 2227  LABPROT 14.5  INR 1.1    Studies/Results: CT ANGIO HEAD NECK W WO CM  Result Date: 04/11/2021 CLINICAL DATA:  Found unresponsive EXAM: CT ANGIOGRAPHY HEAD AND NECK TECHNIQUE: Multidetector CT imaging of the head and neck was performed using the standard protocol during bolus administration of intravenous contrast. Multiplanar CT image reconstructions and MIPs were obtained to evaluate the vascular anatomy.  Carotid stenosis measurements (when applicable) are obtained utilizing NASCET criteria, using the distal internal carotid diameter as the denominator. RADIATION DOSE REDUCTION: This exam was performed according to the departmental dose-optimization program which includes automated exposure control, adjustment of the mA and/or kV according to patient size and/or use of iterative reconstruction technique. CONTRAST:  121mL OMNIPAQUE IOHEXOL 350 MG/ML SOLN COMPARISON:  No prior CTA, correlation is made with 04/10/2021 CT head FINDINGS: CT HEAD FINDINGS For noncontrast findings, please see same day CT head. CTA NECK FINDINGS Aortic arch: Standard branching. Imaged portion shows no evidence of aneurysm  or dissection. No significant stenosis of the major arch vessel origins. Right carotid system: No evidence of dissection, stenosis (50% or greater) or occlusion. Left carotid system: No evidence of dissection, stenosis (50% or greater) or occlusion. Vertebral arteries: No evidence of dissection, stenosis (50% or greater) or occlusion. 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 Evaluation of the right V3 is limited by beam hardening artifact but it is favored to be patent. The left vertebral artery is patent from its origin to the skull base. Skeleton: No acute osseous abnormality. Other neck: Endotracheal and orogastric tubes noted. Upper chest: Please see same-day CT chest. Review of the MIP images confirms the above findings CTA HEAD FINDINGS Anterior circulation: Both internal carotid arteries are patent to the termini, without significant stenosis. A1 segments patent, with a relatively diminutive left A1. Normal anterior communicating artery. Anterior cerebral arteries are patent to their distal aspects. No M1 stenosis or occlusion. Normal MCA bifurcations. Distal MCA branches perfused and symmetric. Posterior circulation: The right vertebral artery appears to terminate in PICA, although evaluation is somewhat limited by diminutive size and beam hardening artifact. The left vertebral artery is patent to the vertebrobasilar junction. Posterior inferior cerebral arteries patent bilaterally. Basilar patent to its distal aspect. Superior cerebellar arteries patent bilaterally. Patent P1 segments. PCAs perfused to their distal aspects without stenosis. The bilateral posterior communicating arteries are not visualized. Venous sinuses: As permitted by contrast timing, patent. Anatomic variants: None significant Review of the MIP images confirms the above findings IMPRESSION: 1.  No intracranial large vessel occlusion or  significant stenosis. 2. No hemodynamically significant stenosis in the neck. The right vertebral artery appears congenitally diminutive, given the size of the vertebral artery foramen. 3. For findings in the lung apices, please see same-day CT chest. Electronically Signed   By: Merilyn Baba M.D.   On: 04/11/2021 01:06   CT HEAD WO CONTRAST (5MM)  Result Date: 04/10/2021 CLINICAL DATA:  Patient found unresponsive. EXAM: CT HEAD WITHOUT CONTRAST TECHNIQUE: Contiguous axial images were obtained from the base of the skull through the vertex without intravenous contrast. RADIATION DOSE REDUCTION: This exam was performed according to the departmental dose-optimization program which includes automated exposure control, adjustment of the mA and/or kV according to patient size and/or use of iterative reconstruction technique. COMPARISON:  None. FINDINGS: Brain: There is mild cerebral atrophy with widening of the extra-axial spaces and ventricular dilatation. There are areas of decreased attenuation within the white matter tracts of the supratentorial brain, consistent with microvascular disease changes. 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 Vascular: No hyperdense vessel or unexpected calcification. Skull: Normal. Negative for fracture or focal lesion. Sinuses/Orbits: There is moderate severity posterior left maxillary sinus, bilateral ethmoid sinus and bilateral nasal mucosal thickening. Mild sphenoid sinus and posterior right maxillary sinus mucosal  thickening is also seen. Other: Endotracheal and enteric tubes are seen. IMPRESSION: 1. No acute intracranial abnormality. 2. Mild cerebral atrophy with widening of the extra-axial spaces and ventricular dilatation. 3. 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 4. Moderate severity paranasal sinus disease. Electronically Signed   By: Virgina Norfolk M.D.   On: 04/10/2021 23:38   CT Cervical Spine Wo Contrast  Result Date:  04/10/2021 CLINICAL DATA:  Found unresponsive. EXAM: CT CERVICAL SPINE WITHOUT CONTRAST TECHNIQUE: Multidetector CT imaging of the cervical spine was performed without intravenous contrast. Multiplanar CT image reconstructions were also generated. RADIATION DOSE REDUCTION: This exam was performed according to the departmental dose-optimization program which includes automated exposure control, adjustment of the mA and/or kV according to patient size and/or use of iterative reconstruction technique. COMPARISON:  None. FINDINGS: Alignment: Normal. Skull base and vertebrae: No acute fracture. Chronic and degenerative changes seen involving the body and tip of the dens. No primary bone lesion or focal pathologic process. Soft tissues and spinal canal: No prevertebral fluid or swelling. No visible canal hematoma. Disc levels: Marked severity endplate sclerosis and mild associated osteophyte formation are seen at the levels of C5-C6 and C6-C7. There is marked severity narrowing of the anterior atlantoaxial articulation. Moderate to marked severity intervertebral disc space narrowing is seen at C5-C6 and C6-C7. Bilateral moderate to marked severity multilevel facet joint hypertrophy is noted. Upper chest: Mild atelectatic changes are seen along the posterior aspect of the right apex. Other: Endotracheal and enteric tubes are in place. IMPRESSION: 1. No acute fracture or subluxation of the cervical spine. 2. Marked severity degenerative changes at the levels of C5-C6 and C6-C7. Electronically Signed   By: Virgina Norfolk M.D.   On: 04/10/2021 23:41   CT T-SPINE NO CHARGE  Result Date: 04/10/2021 CLINICAL DATA:  Unresponsive abnormal posture EXAM: CT Thoracic and Lumbar spine without contrast TECHNIQUE: Multiplanar CT images of the thoracic and lumbar spine were reconstructed from contemporary CT of the Chest, Abdomen, and Pelvis. RADIATION DOSE REDUCTION: This exam was performed according to the departmental  dose-optimization program which includes automated exposure control, adjustment of the mA and/or kV according to patient size and/or use of iterative reconstruction technique. CONTRAST:  None or No additional COMPARISON:  04/10/2021 FINDINGS: CT THORACIC SPINE FINDINGS Alignment: Normal. Vertebrae: No acute fracture or focal pathologic process. Probable small hemangioma at T7. Paraspinal and other soft tissues: See separately dictated CT angiography report. Disc levels: Disc spaces are patent. CT LUMBAR SPINE FINDINGS Segmentation: 5 lumbar type vertebrae. Alignment: Normal. Vertebrae: No acute fracture or focal pathologic process. Paraspinal and other soft tissues: Dictated separately. Disc levels: Disc spaces are patent. No high-grade canal stenosis. Mild disc bulge at L4-L5. Hypertrophic facet degenerative changes at L4-L5 and L5-S1. IMPRESSION: 1. No CT evidence for acute osseous abnormality of the thoracic or lumbar spine 2. 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 Mild degenerative changes at L4-L5 and L5-S1 Electronically Signed   By: Donavan Foil M.D.   On: 04/10/2021 23:57   CT L-SPINE NO CHARGE  Result Date: 04/10/2021 CLINICAL DATA:  Unresponsive abnormal posture EXAM: CT Thoracic and Lumbar spine without contrast TECHNIQUE: Multiplanar CT images of the thoracic and lumbar spine were reconstructed from contemporary CT of the Chest, Abdomen, and Pelvis. RADIATION DOSE REDUCTION: This exam was performed according to the departmental dose-optimization program which includes automated exposure control, adjustment of the mA and/or kV according to patient size and/or use of iterative reconstruction technique. CONTRAST:  None or No additional  COMPARISON:  04/10/2021 FINDINGS: CT THORACIC SPINE FINDINGS Alignment: Normal. Vertebrae: No acute fracture or focal pathologic process. Probable small hemangioma at T7. Paraspinal and other soft tissues: See separately dictated CT angiography report. Disc levels: Disc spaces are  patent. CT LUMBAR SPINE FINDINGS Segmentation: 5 lumbar type vertebrae. Alignment: Normal. Vertebrae: No acute fracture or focal pathologic process. Paraspinal and other soft tissues: Dictated separately. Disc levels: Disc spaces are patent. No high-grade canal stenosis. Mild disc bulge at L4-L5. Hypertrophic facet degenerative changes at L4-L5 and L5-S1. IMPRESSION: 1. No CT evidence for acute osseous abnormality of the thoracic or lumbar spine 2. 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 Mild degenerative changes at L4-L5 and L5-S1 Electronically Signed   By: Donavan Foil M.D.   On: 04/10/2021 23:57   DG Chest Port 1 View  Result Date: 04/12/2021 CLINICAL DATA:  Respiratory failure, seizures, aspiration EXAM: PORTABLE CHEST 1 VIEW COMPARISON:  CT examination dated April 10, 2021 FINDINGS: The heart size and mediastinal contours are within normal limits. Endotracheal tube with distal tip approximately 5 cm above the carina. Feeding tube with side port in the distal esophagus and distal tip at the GE junction. Bilateral hazy lung opacities, left worse than the right are unchanged. Elevation of the right hemidiaphragm. No large pleural effusion or pneumothorax. The visualized skeletal structures are unremarkable. IMPRESSION: 1.  Endotracheal tube approximately 5 cm above the carina. 2. Feeding tube with side port in the distal esophagus and distal tip about the GE junction, it could be advanced 6-7 cm. 2. Elevation of the right hemidiaphragm. Bibasilar opacities, left worse than the right, concerning for bilateral pneumonia. No large pleural effusion or pneumothorax. Electronically Signed   By: Keane Police D.O.   On: 04/12/2021 08:28   DG Chest Portable 1 View  Result Date: 04/10/2021 CLINICAL DATA:  Intubated EXAM: PORTABLE CHEST 1 VIEW COMPARISON:  None. FINDINGS: Endotracheal tube tip is about 2.6 cm superior to carina. Esophageal tube tip below the diaphragm but incompletely visualized. Mildly low lung volumes. Left  lung is grossly clear. Irregular right hilar opacity. No pleural effusion or pneumothorax IMPRESSION: 1. Endotracheal tube tip about 2.6 cm superior to carina. 2. Irregular right hilar opacity could be due to low lung volume and vascular crowding however lung mass is also considered. Recommend further evaluation with chest CT. Electronically Signed   By: Donavan Foil M.D.   On: 04/10/2021 22:37   EEG adult  Result Date: 04/11/2021 Lora Havens, MD     04/11/2021  8:42 AM Patient Name: Daquawn Seelman MRN: [id number] Epilepsy Attending: Lora Havens Referring Physician/Provider: Amie Portland, MD Date: 04/10/2021 Duration: 22.25 mins Patient history: 76 year old male found unresponsive.  EMS was called and reported 2 seizures for which she received Versed.  EEG evaluate for seizure Level of alertness: lethargic AEDs during EEG study: LEV Technical aspects: This EEG study was done with scalp electrodes positioned according to the 10-20 International system of electrode placement. Electrical activity was acquired at a sampling rate of 500Hz  and reviewed with a high frequency filter of 70Hz  and a low frequency filter of 1Hz . EEG data were recorded continuously and digitally stored. Description: EEG showed continuous generalized low amplitude mixed frequencies with predominantly 6 to 9 Hz theta-alpha activity admixed with intermittent generalized 2 to 3 Hz delta slowing.  Hyperventilation and photic stimulation were not performed.   ABNORMALITY - Continuous slow, generalized IMPRESSION: This study is suggestive of moderate diffuse encephalopathy, nonspecific etiology. No seizures or epileptiform discharges  were seen throughout the recording. Lora Havens   CT Angio Chest/Abd/Pel for Dissection W and/or W/WO  Result Date: 04/10/2021 CLINICAL DATA:  Polytrauma, blunt FALL, FOUND DOWN EXAM: CT ANGIOGRAPHY CHEST, ABDOMEN AND PELVIS TECHNIQUE: Non-contrast CT of the chest was initially obtained. Multidetector  CT imaging through the chest, abdomen and pelvis was performed using the standard protocol during bolus administration of intravenous contrast. Multiplanar reconstructed images and MIPs were obtained and reviewed to evaluate the vascular anatomy. RADIATION DOSE REDUCTION: This exam was performed according to the departmental dose-optimization program which includes automated exposure control, adjustment of the mA and/or kV according to patient size and/or use of iterative reconstruction technique. CONTRAST:  182mL OMNIPAQUE IOHEXOL 350 MG/ML SOLN COMPARISON:  Radiograph earlier today FINDINGS: CTA CHEST FINDINGS Cardiovascular: No aortic dissection, aneurysm, acute aortic syndrome, or aortic hematoma noncontrast exam. Mild atherosclerosis. Conventional branching pattern from the aortic arch. Exam not tailored for pulmonary artery evaluation, no obvious central pulmonary embolus. Coronary artery calcifications. Heart is normal in size no pericardial effusion. Mediastinum/Nodes: Endotracheal tube tip above the carina. Enteric tube is in place, despite decompression there is fluid in the esophagus. Scattered small mediastinal lymph nodes not enlarged by size criteria. No suspicious thyroid nodule. Lungs/Pleura: There is complete filling of the right bronchus intermedius with segmental occlusion of the right middle and lower lobe bronchi. Right upper lobe and left-sided bronchial thickening with areas of mucous plugging. Multifocal tree-in-bud ground-glass nodularity within both lungs, perihilar and dependent predominant. No pneumothorax or pleural effusion. Incidental azygous fissure. Musculoskeletal: No acute fractures. Incidental T7 vertebral body hemangioma. No chest wall soft tissue abnormalities. Review of the MIP images confirms the above findings. CTA ABDOMEN AND PELVIS FINDINGS VASCULAR Aorta: Moderate atherosclerosis. Normal caliber aorta without aneurysm, dissection, vasculitis or significant stenosis.  Celiac: Patent without evidence of aneurysm, dissection, vasculitis or significant stenosis. SMA: Patent without evidence of aneurysm, dissection, vasculitis or significant stenosis. Renals: Both renal arteries are patent without evidence of aneurysm, dissection, vasculitis, fibromuscular dysplasia or significant stenosis. IMA: Patent without evidence of aneurysm, dissection, vasculitis or significant stenosis. Inflow: Patent without evidence of aneurysm, dissection, vasculitis or significant stenosis. Veins: No obvious venous abnormality within the limitations of this arterial phase study. There is a circumaortic left renal vein. Review of the MIP images confirms the above findings. NON-VASCULAR Hepatobiliary: No focal hepatic abnormality on this arterial phase exam. Multiple gallstones. No gallbladder inflammation or biliary dilatation. Pancreas: No ductal dilatation or inflammation. Spleen: Normal in size and arterial enhancement. Adrenals/Urinary Tract: No adrenal nodule. No hydronephrosis. Homogeneous renal enhancement. No visualized renal calculi. Only minimally distended urinary bladder. Stomach/Bowel: Tip of the enteric tube within the stomach. There is fluid distending the distal esophagus. Fluid within the stomach. Small bowel are fluid-filled and mildly prominent, no discrete transition point to suggest obstruction. There is no small bowel pneumatosis or obvious small bowel inflammation. Fluid/liquid stool in the right, at descending and sigmoid colon. Suspected appendectomy with surgical sutures at the base of the cecum. Scattered colonic diverticulosis. There is no diverticulitis or colonic wall thickening. No colonic pneumatosis. Lymphatic: No abdominopelvic adenopathy. Calcification in the right upper abdomen posterior to the right lobe of the liver may represent calcified lymph known in sequela of prior granulomatous disease. Reproductive: Enlarged prostate gland spans 5.6 cm transverse. Other: No  free air or abdominopelvic ascites. There is minimal fat in the inguinal canals. Musculoskeletal: No acute fracture. No abdominal wall soft tissue abnormalities. Review of the MIP images confirms the above  findings. IMPRESSION: 1. No aortic dissection or acute aortic findings. Mild atherosclerosis. 2. No evidence of acute traumatic injury to the chest, abdomen, or pelvis. 3. Complete bronchial filling of bronchus intermedius. Additional diffuse bronchial thickening and areas mucous plugging. Multifocal tree-in-bud ground-glass nodularity within both lungs, perihilar and dependent predominant. Fluid distending the distal esophagus. Constellation of findings suspicious for aspiration. 4. Fluid-filled and mildly prominent small bowel, no discrete transition point to suggest obstruction. Findings may represent enteritis, ileus, or slow transit. There is also liquid stool in the colon suggesting diarrheal process. 5. Colonic diverticulosis without acute diverticulitis. 6. Cholelithiasis without acute cholecystitis. 7. Enlarged prostate gland. Aortic Atherosclerosis (ICD10-I70.0). Electronically Signed   By: Keith Rake M.D.   On: 04/10/2021 23:52       Assessment / Plan:    #51 76 year old white male with acute onset of illness 04/10/2021 with abrupt onset of nausea, abdominal pain, diarrhea followed by syncope.  Patient remained unresponsive on arrival to ER and was hypotensive.  Initial concerns for seizure activity Pt was intubated EEG negative for seizure activity more consistent with diffuse encephalopathy  CT of the abdomen pelvis on admission showed a lot of fluid in the stomach, esophagus, and small bowel consistent with early illness or acute gastroenteritis, no transition point and also had fluid-filled colon consistent with diarrheal illness  Precipitating event unclear though suspect acute GI insult/gastroenteritis rule out infectious  Also need to consider testing no angioedema inpatient on  lisinopril  Very interesting this patient has had 4 episodes over the past 10 to 12 years of this same presentation though previous episodes of syncope were brief and uncomplicated  #2 ground effluent from OG-no evidence for active GI bleeding-hemoglobin has drifted but likely multifactoral/delusional component Continue PPI twice daily  #3 acute kidney injury #4 acute hypoxic respiratory failure secondary to aspiration and mucous plugging-bilateral pneumonia on imaging today   Plan; hopefully can be successfully extubated today Follow-up abdominal films Proceed with stool studies when able Continue PPI IV twice daily Follow hemoglobin Consider discontinuing lisinopril permanently  GI will plan further GI work-up, probably as an outpatient once patient recovers from the acute illness We will consider CT enterography versus enteroscopy    Principal Problem:   Seizures ([place])     LOS: 1 day   Amy Esterwood PA-C 04/12/2021, 9:52 AM    Attending physician's note   I have taken history, reviewed the chart and examined the patient. I performed a substantive portion of this encounter, including complete performance of at least one of the key components, in conjunction with the APP. I agree with the Advanced Practitioner's note, impression and recommendations.   Extubated-doing well. No further bleeding. No diarrhea.  Further GI work-up as outpatient Episodes of diarrhea/Abdo pain/syncope- ?Vasovagal Can discuss with cardiology (Dr Burt Knack) to switch ACE to ?B- blockers.  Some reports regarding is causing GI angioedema. D/W family including daughter who works here in [employer] We will sign off for now FU GI in 6 to 8 weeks.   Carmell Austria, MD [facility] [phone]

## 2021-04-12 NOTE — Telephone Encounter (Signed)
Pt scheduled for an office visit with Dr. Lyndel Safe on 05/30/2021 at 10:00 AM:  Pt currently admitted in Hospital: Will make pt aware after discharged

## 2021-04-13 ENCOUNTER — Inpatient Hospital Stay (HOSPITAL_COMMUNITY): Payer: Medicare (Managed Care)

## 2021-04-13 LAB — CBC
HCT: 30.5 % — ABNORMAL LOW (ref 39.0–52.0)
Hemoglobin: 9.8 g/dL — ABNORMAL LOW (ref 13.0–17.0)
MCH: 27.1 pg (ref 26.0–34.0)
MCHC: 32.1 g/dL (ref 30.0–36.0)
MCV: 84.3 fL (ref 80.0–100.0)
Platelets: 123 10*3/uL — ABNORMAL LOW (ref 150–400)
RBC: 3.62 MIL/uL — ABNORMAL LOW (ref 4.22–5.81)
RDW: 14.3 % (ref 11.5–15.5)
WBC: 5.7 10*3/uL (ref 4.0–10.5)
nRBC: 0 % (ref 0.0–0.2)

## 2021-04-13 LAB — GLUCOSE, CAPILLARY
Glucose-Capillary: 128 mg/dL — ABNORMAL HIGH (ref 70–99)
Glucose-Capillary: 95 mg/dL (ref 70–99)
Glucose-Capillary: 86 mg/dL (ref 70–99)
Glucose-Capillary: 87 mg/dL (ref 70–99)
Glucose-Capillary: 101 mg/dL — ABNORMAL HIGH (ref 70–99)
Glucose-Capillary: 110 mg/dL — ABNORMAL HIGH (ref 70–99)

## 2021-04-13 LAB — BASIC METABOLIC PANEL
Anion gap: 10 (ref 5–15)
Anion gap: 8 (ref 5–15)
BUN: 27 mg/dL — ABNORMAL HIGH (ref 8–23)
BUN: 26 mg/dL — ABNORMAL HIGH (ref 8–23)
CO2: 25 mmol/L (ref 22–32)
CO2: 26 mmol/L (ref 22–32)
Calcium: 8.3 mg/dL — ABNORMAL LOW (ref 8.9–10.3)
Calcium: 8.4 mg/dL — ABNORMAL LOW (ref 8.9–10.3)
Chloride: 105 mmol/L (ref 98–111)
Chloride: 106 mmol/L (ref 98–111)
Creatinine, Ser: 1.42 mg/dL — ABNORMAL HIGH (ref 0.61–1.24)
Creatinine, Ser: 1.32 mg/dL — ABNORMAL HIGH (ref 0.61–1.24)
GFR, Estimated: 52 mL/min — ABNORMAL LOW (ref >60)
GFR, Estimated: 56 mL/min — ABNORMAL LOW (ref >60)
Glucose, Bld: 86 mg/dL (ref 70–99)
Glucose, Bld: 95 mg/dL (ref 70–99)
Potassium: 3.9 mmol/L (ref 3.5–5.1)
Potassium: 3.9 mmol/L (ref 3.5–5.1)
Sodium: 140 mmol/L (ref 135–145)
Sodium: 140 mmol/L (ref 135–145)

## 2021-04-13 LAB — C DIFFICILE QUICK SCREEN W PCR REFLEX
C Diff antigen: NEGATIVE
C Diff interpretation: NOT DETECTED
C Diff toxin: NEGATIVE

## 2021-04-13 LAB — MAGNESIUM: Magnesium: 2.2 mg/dL (ref 1.7–2.4)

## 2021-04-13 MED ORDER — MELATONIN 3 MG PO TABS
3.0000 mg | ORAL_TABLET | Freq: Every evening | ORAL | Status: DC | PRN
  Administered 2021-04-13 – 2021-04-14 (×3): 3 mg via ORAL
  Filled 2021-04-13 (×3): qty 1

## 2021-04-13 MED ORDER — BENZONATATE 100 MG PO CAPS
200.0000 mg | ORAL_CAPSULE | Freq: Three times a day (TID) | ORAL | Status: DC | PRN
  Administered 2021-04-13 – 2021-04-15 (×5): 200 mg via ORAL
  Filled 2021-04-13 (×6): qty 2

## 2021-04-13 MED ORDER — FUROSEMIDE 10 MG/ML IJ SOLN
40.0000 mg | Freq: Once | INTRAMUSCULAR | Status: AC
  Administered 2021-04-13: 40 mg via INTRAVENOUS
  Filled 2021-04-13: qty 4

## 2021-04-13 MED ORDER — BENZONATATE 100 MG PO CAPS
100.0000 mg | ORAL_CAPSULE | Freq: Two times a day (BID) | ORAL | Status: DC
  Filled 2021-04-13: qty 1

## 2021-04-13 MED ORDER — GUAIFENESIN-DM 100-10 MG/5ML PO SYRP
10.0000 mL | ORAL_SOLUTION | ORAL | Status: DC | PRN
  Administered 2021-04-13 – 2021-04-15 (×8): 10 mL via ORAL
  Filled 2021-04-13 (×10): qty 10

## 2021-04-13 MED ORDER — ENOXAPARIN SODIUM 40 MG/0.4ML IJ SOSY
40.0000 mg | PREFILLED_SYRINGE | INTRAMUSCULAR | Status: DC
  Administered 2021-04-13: 40 mg via SUBCUTANEOUS
  Filled 2021-04-13: qty 0.4

## 2021-04-13 MED ORDER — GADOBUTROL 1 MMOL/ML IV SOLN
8.0000 mL | Freq: Once | INTRAVENOUS | Status: AC | PRN
  Administered 2021-04-13: 8 mL via INTRAVENOUS

## 2021-04-13 MED ORDER — INSULIN ASPART 100 UNIT/ML IJ SOLN
0.0000 [IU] | Freq: Three times a day (TID) | INTRAMUSCULAR | Status: DC
  Administered 2021-04-14 (×2): 2 [IU] via SUBCUTANEOUS

## 2021-04-13 MED ORDER — PHENOL 1.4 % MT LIQD
1.0000 | OROMUCOSAL | Status: AC | PRN
  Administered 2021-04-13: 1 via OROMUCOSAL
  Filled 2021-04-13: qty 177

## 2021-04-13 NOTE — Progress Notes (Addendum)
[facility] Progress Note Patient Name: Christopher Francis DOB: 1945-11-26 MRN: [id number]   Date of Service  04/13/2021  HPI/Events of Note  RN reports pt has cough suppressants ordered but continues to cough continuously & has not been able to sleep at all.  Pt c/o feeling tickle in his throat.  Asking if you will order throat spray & possibly different cough suppressant.  eICU Interventions  Chorseptic spray , already on robitussin and tessalon pearls.      Intervention Category Minor Interventions: Other: (cough)  Elmer Sow 04/13/2021, 8:48 PM

## 2021-04-13 NOTE — Evaluation (Signed)
Physical Therapy Evaluation Patient Details Name: Christopher Francis MRN: [id number] DOB: 07-Apr-1945 Today's Date: 04/13/2021  History of Present Illness  76 y.o. male past medical history of hypertension, recent GI symptoms reported by family.  patient had an unresponsive event with a fall in the bathroom.  EMS was called and witnessed what they described as 2 seizures.  Clinical Impression  Pt presents to PT with deficits in gait, balance, endurance, cognition. Pt with slowed processing and reduced awareness of RUE deficits at this time. PT notes widened BOS throughout transfers and ambulation, likely to compensate for balance deviations. Pt also fatiguing quickly, with notable cough. Pt requiring 4L [place] at rest and with mobility in order to maintain sats in 90s. Pt will benefit from continued frequent mobilization in an effort to restore independence.       Recommendations for follow up therapy are one component of a multi-disciplinary discharge planning process, led by the attending physician.  Recommendations may be updated based on patient status, additional functional criteria and insurance authorization.  Follow Up Recommendations Home health PT    Assistance Recommended at Discharge Intermittent Supervision/Assistance  Patient can return home with the following  A little help with walking and/or transfers;A little help with bathing/dressing/bathroom;Assistance with cooking/housework;Assistance with feeding;Direct supervision/assist for medications management;Direct supervision/assist for financial management;Assist for transportation;Help with stairs or ramp for entrance    Equipment Recommendations  (has access to [place], RW, BSC)  Recommendations for Other Services       Functional Status Assessment Patient has had a recent decline in their functional status and demonstrates the ability to make significant improvements in function in a reasonable and predictable amount of time.      Precautions / Restrictions Precautions Precautions: Fall Precaution Comments: monitor SpO2 Restrictions Weight Bearing Restrictions: No      Mobility  Bed Mobility               General bed mobility comments: received and left in recliner    Transfers Overall transfer level: Needs assistance Equipment used: None Transfers: Sit to/from Stand Sit to Stand: Min guard                Ambulation/Gait Ambulation/Gait assistance: Min guard Gait Distance (Feet): 200 Feet Assistive device: None Gait Pattern/deviations: Step-through pattern, Wide base of support Gait velocity: reduced Gait velocity interpretation: <1.8 ft/sec, indicate of risk for recurrent falls   General Gait Details: pt with slowed step-through gait, widened BOS, with fatigue pt searching for UE support  Stairs            Wheelchair Mobility    Modified Rankin (Stroke Patients Only)       Balance Overall balance assessment: Needs assistance Sitting-balance support: Feet supported, No upper extremity supported Sitting balance-Leahy Scale: Good     Standing balance support: During functional activity, No upper extremity supported Standing balance-Leahy Scale: Poor Standing balance comment: benefits from minG                             Pertinent Vitals/Pain Pain Assessment Pain Assessment: No/denies pain    Home Living Family/patient expects to be discharged to:: Private residence Living Arrangements: Spouse/significant other Available Help at Discharge: Family;Available 24 hours/day Type of Home: House Home Access: Stairs to enter Entrance Stairs-Rails: None Entrance Stairs-Number of Steps: 3   Home Layout: One level Home Equipment: None Additional Comments: RW, shower seat and SPC if needed from DIL.  Prior Function Prior Level of Function : Independent/Modified Independent             Mobility Comments: enjoys riding in his 3 mustangs       Hand  Dominance   Dominant Hand: Right    Extremity/Trunk Assessment   Upper Extremity Assessment Upper Extremity Assessment: Defer to OT evaluation RUE Deficits / Details: Shoulder weakness post fall, 2+/5 to shoulder and 4/5 to pivots distally. RUE Sensation: WNL RUE Coordination: decreased gross motor    Lower Extremity Assessment Lower Extremity Assessment: Generalized weakness    Cervical / Trunk Assessment Cervical / Trunk Assessment: Normal  Communication   Communication: No difficulties  Cognition Arousal/Alertness: Awake/alert Behavior During Therapy: Flat affect Overall Cognitive Status: Impaired/Different from baseline Area of Impairment: Awareness, Problem solving                           Awareness: Emergent (reduced awareness of RUE deficits) Problem Solving: Slow processing, Requires verbal cues          General Comments General comments (skin integrity, edema, etc.): pt on RA upon arrival, completing mobility with OT. Pt sats at 84% on RA, with noted increased work of breathing. Pt requires 4L [place] to maintain sats at 92% or above at rest, and demonstrates increased work of breathing and great fatigue when ambulating on 4L [place] at this time    Exercises     Assessment/Plan    PT Assessment Patient needs continued PT services  PT Problem List Decreased strength;Decreased activity tolerance;Decreased balance;Decreased mobility;Decreased cognition;Cardiopulmonary status limiting activity       PT Treatment Interventions DME instruction;Gait training;Stair training;Functional mobility training;Therapeutic activities;Therapeutic exercise;Balance training;Neuromuscular re-education;Cognitive remediation;Patient/family education    PT Goals (Current goals can be found in the Care Plan section)  Acute Rehab PT Goals Patient Stated Goal: to return to independence PT Goal Formulation: With patient/family Time For Goal Achievement: 04/27/21 Potential to  Achieve Goals: Good    Frequency Min 3X/week     Co-evaluation               AM-PAC PT "6 Clicks" Mobility  Outcome Measure Help needed turning from your back to your side while in a flat bed without using bedrails?: A Little Help needed moving from lying on your back to sitting on the side of a flat bed without using bedrails?: A Little Help needed moving to and from a bed to a chair (including a wheelchair)?: A Little Help needed standing up from a chair using your arms (e.g., wheelchair or bedside chair)?: A Little Help needed to walk in hospital room?: A Little Help needed climbing 3-5 steps with a railing? : Total 6 Click Score: 16    End of Session Equipment Utilized During Treatment: Oxygen Activity Tolerance: Patient limited by fatigue Patient left: in chair;with call bell/phone within reach;with family/visitor present Nurse Communication: Mobility status PT Visit Diagnosis: Other abnormalities of gait and mobility (R26.89);Muscle weakness (generalized) (M62.81)    Time: 8453-6468 PT Time Calculation (min) (ACUTE ONLY): 22 min   Charges:   PT Evaluation $PT Eval Low Complexity: [facility], PT, DPT Acute Rehabilitation Pager: [phone] Office [phone]   Zenaida Niece 04/13/2021, 1:17 PM

## 2021-04-13 NOTE — Progress Notes (Signed)
[facility] Progress Note Patient Name: Tay Whitwell DOB: 10/11/1945 MRN: [id number]   Date of Service  04/13/2021  HPI/Events of Note  Nursing concerned about frequent multiform PVC's.  eICU Interventions  Plan: BMP and Mg++ level STAT.     Intervention Category Major Interventions: Arrhythmia - evaluation and management  Tycen Dockter [place] 04/13/2021, 12:05 AM

## 2021-04-13 NOTE — Evaluation (Signed)
Occupational Therapy Evaluation Patient Details Name: Christopher Francis MRN: [id number] DOB: 1946/02/16 Today's Date: 04/13/2021   History of Present Illness 76 y.o. male past medical history of hypertension, recent GI symptoms reported by family.  patient had an unresponsive event with a fall in the bathroom.  EMS was called and witnessed what they described as 2 seizures.   Clinical Impression   Patient admitted for the diagnosis above.  PTA he lives at home with his souse, who is able to assist as needed.  At home he is normally independent with all mobility, ADL, IADL and continues to drive and care for yard work.  Deficits impacting independence are listed below.  Currently he unsteady on his feet, and somewhat lethargic.  He is needing up to Mod A for ADL completion and Min A for in room mobility/toileting.   Of note, he appears to have injured his right shoulder, so HH OT versus outpatient is recommended to ensure a safe transition home and implementation of an upper extremity HEP.  OT will follow in the acute setting.        Recommendations for follow up therapy are one component of a multi-disciplinary discharge planning process, led by the attending physician.  Recommendations may be updated based on patient status, additional functional criteria and insurance authorization.   Follow Up Recommendations  Home health OT versus outpatient depending on preference.     Assistance Recommended at Discharge Intermittent Supervision/Assistance  Patient can return home with the following      Functional Status Assessment  Patient has had a recent decline in their functional status and demonstrates the ability to make significant improvements in function in a reasonable and predictable amount of time.  Equipment Recommendations  Other (comment) (spouse can get a shower chair if needed)    Recommendations for Other Services       Precautions / Restrictions Precautions Precautions:  Fall Restrictions Weight Bearing Restrictions: No      Mobility Bed Mobility Overal bed mobility: Needs Assistance Bed Mobility: Supine to Sit     Supine to sit: Min assist     General bed mobility comments: assist with trunk, unalbe to use R UE Patient Response: Cooperative  Transfers Overall transfer level: Needs assistance   Transfers: Sit to/from Stand, Bed to chair/wheelchair/BSC Sit to Stand: Min guard     Step pivot transfers: Min assist            Balance Overall balance assessment: Needs assistance Sitting-balance support: Single extremity supported, Feet supported Sitting balance-Leahy Scale: Good     Standing balance support: Single extremity supported Standing balance-Leahy Scale: Fair                             ADL either performed or assessed with clinical judgement   ADL Overall ADL's : Needs assistance/impaired     Grooming: Wash/dry hands;Min guard;Standing           Upper Body Dressing : Moderate assistance;Standing   Lower Body Dressing: Moderate assistance;Sit to/from stand   Toilet Transfer: Minimal assistance;Ambulation;Regular Toilet   Toileting- Water quality scientist and Hygiene: Moderate assistance;Sit to/from stand               Vision Patient Visual Report: No change from baseline       Perception Perception Perception: Within Functional Limits   Praxis Praxis Praxis: Intact    Pertinent Vitals/Pain Pain Assessment Pain Assessment: No/denies pain  Hand Dominance Right   Extremity/Trunk Assessment Upper Extremity Assessment Upper Extremity Assessment: RUE deficits/detail RUE Deficits / Details: Shoulder weakness post fall, 2+/5 to shoulder and 4/5 to pivots distally. RUE Sensation: WNL RUE Coordination: decreased gross motor   Lower Extremity Assessment Lower Extremity Assessment: Defer to PT evaluation   Cervical / Trunk Assessment Cervical / Trunk Assessment: Normal    Communication Communication Communication: HOH   Cognition Arousal/Alertness: Awake/alert Behavior During Therapy: WFL for tasks assessed/performed Overall Cognitive Status: Impaired/Different from baseline Area of Impairment: Memory, Problem solving, Following commands, Orientation                 Orientation Level: Disoriented to, Situation   Memory: Decreased short-term memory Following Commands: Follows one step commands with increased time     Problem Solving: Slow processing, Requires verbal cues       General Comments   Desaturating during mobility to 86% on RA.      Exercises     Shoulder Instructions      Home Living Family/patient expects to be discharged to:: Private residence Living Arrangements: Spouse/significant other Available Help at Discharge: Family;Available 24 hours/day Type of Home: House Home Access: Stairs to enter [employer] of Steps: 3 Entrance Stairs-Rails: None Home Layout: One level     Bathroom Shower/Tub: Occupational psychologist: Standard Bathroom Accessibility: Yes How Accessible: Accessible via walker Home Equipment: None   Additional Comments: RW, shower seat and SPC if needed from DIL.      Prior Functioning/Environment Prior Level of Function : Independent/Modified Independent                        OT Problem List: Decreased strength;Decreased range of motion;Decreased activity tolerance;Impaired balance (sitting and/or standing);Decreased safety awareness;Impaired UE functional use      OT Treatment/Interventions: Self-care/ADL training;Therapeutic exercise;Balance training;Therapeutic activities    OT Goals(Current goals can be found in the care plan section) Acute Rehab OT Goals Patient Stated Goal: Return home OT Goal Formulation: With patient Time For Goal Achievement: 04/27/21 Potential to Achieve Goals: Good ADL Goals Pt Will Perform Grooming: with set-up;standing Pt Will  Perform Upper Body Bathing: with set-up;sitting Pt Will Perform Lower Body Bathing: with set-up;sit to/from stand Pt Will Perform Upper Body Dressing: with set-up;standing Pt Will Perform Lower Body Dressing: with set-up;sit to/from stand Pt Will Transfer to Toilet: with set-up;ambulating Pt Will Perform Toileting - Clothing Manipulation and hygiene: with set-up;sitting/lateral leans  OT Frequency: Min 2X/week    Co-evaluation              AM-PAC OT "6 Clicks" Daily Activity     Outcome Measure Help from another person eating meals?: None Help from another person taking care of personal grooming?: A Little Help from another person toileting, which includes using toliet, bedpan, or urinal?: A Little Help from another person bathing (including washing, rinsing, drying)?: A Lot Help from another person to put on and taking off regular upper body clothing?: A Lot Help from another person to put on and taking off regular lower body clothing?: A Lot 6 Click Score: 16   End of Session Nurse Communication: Mobility status  Activity Tolerance: Patient tolerated treatment well Patient left: in chair;with call bell/phone within reach  OT Visit Diagnosis: Unsteadiness on feet (R26.81);Muscle weakness (generalized) (M62.81)                Time: 3149-7026 OT Time Calculation (min): 20 min Charges:  OT  General Charges $OT Visit: 1 Visit OT Evaluation $OT Eval Moderate Complexity: 1 Mod  04/13/2021  RP, OTR/L  Acute Rehabilitation Services  Office:  [phone]   Metta Clines 04/13/2021, 12:39 PM

## 2021-04-13 NOTE — Progress Notes (Signed)
NAME:  Christopher Francis, MRN:  [id number], DOB:  08-22-1945, LOS: 2 ADMISSION DATE:  04/10/2021, CONSULTATION DATE:  04/11/21 REFERRING MD:  Billy Fischer, CHIEF COMPLAINT:  AMS, seizures   History of Present Illness:  Christopher Francis is 76 yo M with past medical history of chest pain and seizures.  He presented to [facility] ED 2/20 with AMS.  He had GI symptoms that started 24 hour prior to presentation and he was noted to sleep 5-6 hrs during day which is unusual for him.  His wife stated that he got up to go to bathroom and she heard a thud then found him unresponsive in bathroom. EMS called and patient noted to have 2 seizures during transport.  He was intubated on arrival to ED and OG tube place with large amount of dark coffee-ground like output, 750 cc.  Seen by neuro in ED. Loaded with Keppra and EEG started.  Pertinent  Medical History  Chest pain Seizures  Significant Hospital Events: Including procedures, antibiotic start and stop dates in addition to other pertinent events   2/20 found unresponsive on floor at home, intubated 2/21 intubated, sedate, pressors needed 2/22 extubated  Interim History / Subjective:  O/N: multiple PVC overnight  Patient assessed at bedside this AM. He has frequent coughing that is nonproductive and states that his throat hurts. No other concerns at this time.  He has been tolerating liquids. No further episodes of diarrhea.  Objective   Blood pressure (!) 121/42, pulse 82, temperature 98.8 F (37.1 C), temperature source Axillary, resp. rate (!) 23, height 5' 8.5" (1.74 m), weight 89.4 kg, SpO2 94 %.    Vent Mode: PRVC FiO2 (%):  [40 %] 40 % Set Rate:  [20 bmp] 20 bmp Vt Set:  [580 mL] 580 mL PEEP:  [8 cmH20] 8 cmH20 Plateau Pressure:  [25 cmH20] 25 cmH20   Intake/Output Summary (Last 24 hours) at 04/13/2021 0603 Last data filed at 04/13/2021 0200 Gross per 24 hour  Intake 2737.01 ml  Output 1300 ml  Net 1437.01 ml    Filed Weights   04/11/21 0016  04/11/21 0500 04/12/21 0353  Weight: 81.5 kg 84.8 kg 89.4 kg   Examination: General appearance: well-developed, in no acute distress Eyes: anicteric sclerae, moist conjunctivae; no lid-lag; PERRLA, tracking appropriately HENT: NCAT; oropharynx, MMM, no mucosal ulcerations; normal hard and soft palate Neck: Trachea midline; FROM, supple, no lymphadenopathy, no JVD Lungs: CTAB, no crackles, no wheeze, with normal respiratory effort and no intercostal retractions CV: RRR, S1, S2, no MRGs  Abdomen: Soft, non-tender; non-distended, BS present  Extremities: No peripheral edema, radial and DP pulses present bilaterally  Skin: Normal temperature, turgor and texture; no rash Psych: Appropriate affect Neuro: Alert and oriented to person and place, no focal deficit    Labs/imaging personally reviewed: Creatinine 1.82-> 1.32 BUN 30->26, GFR 38-> 56 Mg 2.2 Hgb 11.8-> 9.8 Platelets 170-> 123 CXR showed bilateral opacities L.R. no large pleural effusion or pneumothorax. C diff negative GI panel pending MRI pending   Resolved Hospital Problem list   NAGMA Metabolic acidosis Acute metabolic encephalopathy Possible Seizure  Leukocytosis Acute hypoxic respiratory failure  Assessment & Plan:   Coffee ground output from OGT, concern for GI bleed History of diarrhea No emesis or diarrhea overnight.  Hgb trended down 11.8->9.8. GI signed off, patient to follow-up with GI in 6-8 weeks. If patient is able to tolerate PO intake and ambulate safely can discharge this PM. -f/u GI path panel -regular diet -IV PPI  BID  AKI Urinary retention Baseline creatinine 1.1.  Creatinine improving to 1.32 with max of 2.19 2/21.  - trend bmp - holding home medication ACEI- HCTZ combo - strict I and Os  Possible Aspiration, mucous plugging on imaging  Patient has remained afebrile, leukocytosis resolved. Lungs clear to auscultation and he has stable oxygen saturations on room air.  - discontinue  antibiotics  Hyperglycemia Pre-diabetes Last A1c 7/22 at 6.7 -SSI moderate TID WC  Best Practice (right click and "Reselect all SmartList Selections" daily)   Diet/type: regualar DVT prophylaxis: other GI prophylaxis: PPI Lines: N/A Foley:  N/A Code Status:  full code Last date of multidisciplinary goals of care discussion [wife and patient updated at bedside 2/23]  Chinmayi Rumer M. Akasia Ahmad, D.O.  Internal Medicine Resident, PGY-1 [facility]  Pager: [phone] 6:03 AM, 04/13/2021

## 2021-04-13 NOTE — Progress Notes (Signed)
[facility] Progress Note Patient Name: Christopher Francis DOB: 05/25/1945 MRN: [id number]   Date of Service  04/13/2021  HPI/Events of Note  Multiple issues: 1. Cough - Continuous since extubation. 2. Nursing request for sleep aid.   eICU Interventions  Plan: Robitussin DM 10 mL PO Q 4 hours PRN cough.  Melatonin 3 mg PO Q HS PRN sleep.     Intervention Category Major Interventions: Other:  [name] 04/13/2021, 1:30 AM

## 2021-04-14 ENCOUNTER — Inpatient Hospital Stay (HOSPITAL_COMMUNITY): Payer: Medicare (Managed Care)

## 2021-04-14 ENCOUNTER — Encounter: Payer: Self-pay | Admitting: Gastroenterology

## 2021-04-14 ENCOUNTER — Encounter (HOSPITAL_COMMUNITY): Payer: Self-pay | Admitting: Internal Medicine

## 2021-04-14 ENCOUNTER — Other Ambulatory Visit: Payer: Self-pay

## 2021-04-14 DIAGNOSIS — I2699 Other pulmonary embolism without acute cor pulmonale: Secondary | ICD-10-CM

## 2021-04-14 DIAGNOSIS — I2609 Other pulmonary embolism with acute cor pulmonale: Secondary | ICD-10-CM

## 2021-04-14 LAB — CBC
HCT: 32.2 % — ABNORMAL LOW (ref 39.0–52.0)
Hemoglobin: 10.5 g/dL — ABNORMAL LOW (ref 13.0–17.0)
MCH: 27.3 pg (ref 26.0–34.0)
MCHC: 32.6 g/dL (ref 30.0–36.0)
MCV: 83.9 fL (ref 80.0–100.0)
Platelets: 130 10*3/uL — ABNORMAL LOW (ref 150–400)
RBC: 3.84 MIL/uL — ABNORMAL LOW (ref 4.22–5.81)
RDW: 13.8 % (ref 11.5–15.5)
WBC: 7.3 10*3/uL (ref 4.0–10.5)
nRBC: 0 % (ref 0.0–0.2)

## 2021-04-14 LAB — BASIC METABOLIC PANEL
Anion gap: 12 (ref 5–15)
BUN: 25 mg/dL — ABNORMAL HIGH (ref 8–23)
CO2: 23 mmol/L (ref 22–32)
Calcium: 8.8 mg/dL — ABNORMAL LOW (ref 8.9–10.3)
Chloride: 104 mmol/L (ref 98–111)
Creatinine, Ser: 1.17 mg/dL (ref 0.61–1.24)
GFR, Estimated: >60 mL/min (ref >60)
Glucose, Bld: 122 mg/dL — ABNORMAL HIGH (ref 70–99)
Potassium: 3.6 mmol/L (ref 3.5–5.1)
Sodium: 139 mmol/L (ref 135–145)

## 2021-04-14 LAB — GASTROINTESTINAL PANEL BY PCR, STOOL (REPLACES STOOL CULTURE)

## 2021-04-14 LAB — ECHOCARDIOGRAM COMPLETE
AR max vel: 2.54 cm2
AV Peak grad: 6.4 mmHg
Ao pk vel: 1.26 m/s
Area-P 1/2: 4.96 cm2
Calc EF: 57.3 %
Height: 68.5 in
S' Lateral: 2.8 cm
Single Plane A2C EF: 55.2 %
Single Plane A4C EF: 58.4 %
Weight: 3114.66 oz

## 2021-04-14 LAB — GLUCOSE, CAPILLARY
Glucose-Capillary: 101 mg/dL — ABNORMAL HIGH (ref 70–99)
Glucose-Capillary: 122 mg/dL — ABNORMAL HIGH (ref 70–99)
Glucose-Capillary: 128 mg/dL — ABNORMAL HIGH (ref 70–99)
Glucose-Capillary: 111 mg/dL — ABNORMAL HIGH (ref 70–99)
Glucose-Capillary: 161 mg/dL — ABNORMAL HIGH (ref 70–99)

## 2021-04-14 LAB — MAGNESIUM: Magnesium: 1.8 mg/dL (ref 1.7–2.4)

## 2021-04-14 LAB — PROCALCITONIN: Procalcitonin: 14.65 ng/mL

## 2021-04-14 MED ORDER — IOHEXOL 350 MG/ML SOLN
65.0000 mL | Freq: Once | INTRAVENOUS | Status: AC | PRN
Start: 2021-04-14 — End: 2021-04-14
  Administered 2021-04-14: 65 mL via INTRAVENOUS

## 2021-04-14 MED ORDER — APIXABAN 5 MG PO TABS: 5.0000 mg | ORAL_TABLET | Freq: Two times a day (BID) | ORAL | Status: DC

## 2021-04-14 MED ORDER — DOCUSATE SODIUM 100 MG PO CAPS
200.0000 mg | ORAL_CAPSULE | Freq: Once | ORAL | Status: AC
  Administered 2021-04-14: 200 mg via ORAL
  Filled 2021-04-14: qty 2

## 2021-04-14 MED ORDER — LEVETIRACETAM 500 MG PO TABS
500.0000 mg | ORAL_TABLET | Freq: Two times a day (BID) | ORAL | Status: DC
  Administered 2021-04-14 – 2021-04-15 (×3): 500 mg via ORAL
  Filled 2021-04-14 (×3): qty 1

## 2021-04-14 MED ORDER — METOPROLOL TARTRATE 25 MG PO TABS
12.5000 mg | ORAL_TABLET | Freq: Two times a day (BID) | ORAL | Status: DC
  Administered 2021-04-14: 12.5 mg via ORAL
  Filled 2021-04-14: qty 1

## 2021-04-14 MED ORDER — APIXABAN 5 MG PO TABS
10.0000 mg | ORAL_TABLET | Freq: Two times a day (BID) | ORAL | Status: DC
  Administered 2021-04-14 – 2021-04-15 (×3): 10 mg via ORAL
  Filled 2021-04-14 (×3): qty 2

## 2021-04-14 MED ORDER — FUROSEMIDE 10 MG/ML IJ SOLN
40.0000 mg | Freq: Once | INTRAMUSCULAR | Status: AC
  Administered 2021-04-14: 40 mg via INTRAVENOUS
  Filled 2021-04-14: qty 4

## 2021-04-14 NOTE — Progress Notes (Signed)
Bilateral lower extremity venous duplex completed. Refer to "CV Proc" under chart review to view preliminary results.  Preliminary results discussed with Amy, RN and Dr. Pietro Cassis.  04/14/2021 4:17 PM Kelby Aline., MHA, RVT, RDCS, RDMS

## 2021-04-14 NOTE — Progress Notes (Signed)
Physical Therapy Treatment Patient Details Name: Christopher Francis Today's Date: 04/14/2021   History of Present Illness 76 y.o. male past medical history of hypertension, recent GI symptoms reported by family.  patient had an unresponsive event with a fall in the bathroom.  EMS was called and witnessed what they described as 2 seizures.    PT Comments    Pt remains limited by fatigue, although tolerating increased ambulation compared to previous session. Pt continues to desaturate when mobilizing and requires prolonged time for recovery. Pt and spouse report bouts of ambulation similar to this session would typically result in no fatigue. Pt is encouraged to ambulate frequently and continue utilizing incentive spirometer. PT will continue to follow.   Recommendations for follow up therapy are one component of a multi-disciplinary discharge planning process, led by the attending physician.  Recommendations may be updated based on patient status, additional functional criteria and insurance authorization.  Follow Up Recommendations  No PT follow up     Assistance Recommended at Discharge Intermittent Supervision/Assistance  Patient can return home with the following A little help with bathing/dressing/bathroom;Assistance with cooking/housework;Assist for transportation;Help with stairs or ramp for entrance   Equipment Recommendations  None recommended by PT    Recommendations for Other Services       Precautions / Restrictions Precautions Precautions: Fall Precaution Comments: monitor SpO2 Restrictions Weight Bearing Restrictions: No     Mobility  Bed Mobility                    Transfers Overall transfer level: Needs assistance Equipment used: None Transfers: Sit to/from Stand Sit to Stand: Supervision                Ambulation/Gait Ambulation/Gait assistance: Supervision Gait Distance (Feet): 300 Feet Assistive device:  None Gait Pattern/deviations: Step-through pattern Gait velocity: functional Gait velocity interpretation: 1.31 - 2.62 ft/sec, indicative of limited community ambulator   General Gait Details: pt with steady step-through gait, no LOB noted   Stairs             Wheelchair Mobility    Modified Rankin (Stroke Patients Only)       Balance Overall balance assessment: Needs assistance Sitting-balance support: No upper extremity supported, Feet supported Sitting balance-Leahy Scale: Good     Standing balance support: No upper extremity supported, During functional activity Standing balance-Leahy Scale: Good                              Cognition Arousal/Alertness: Awake/alert Behavior During Therapy: WFL for tasks assessed/performed Overall Cognitive Status: Within Functional Limits for tasks assessed                                          Exercises Other Exercises Other Exercises: PT provides education and reinforces use of incentive spirometer    General Comments General comments (skin integrity, edema, etc.): pt on RA upon arrival, sats in high 90s. Pt desats to 87% when ambulating. PT allows for 5 minutes of seated recovery however SpO2 remains from 87-89% with pt reporting fatigue. PT places pt on 1L [place], RN made aware.      Pertinent Vitals/Pain Pain Assessment Pain Assessment: No/denies pain    Home Living  Prior Function            PT Goals (current goals can now be found in the care plan section) Acute Rehab PT Goals Patient Stated Goal: to return to independence Progress towards PT goals: Progressing toward goals    Frequency    Min 3X/week      PT Plan Current plan remains appropriate    Co-evaluation              AM-PAC PT "6 Clicks" Mobility   Outcome Measure  Help needed turning from your back to your side while in a flat bed without using bedrails?: A  Little Help needed moving from lying on your back to sitting on the side of a flat bed without using bedrails?: A Little Help needed moving to and from a bed to a chair (including a wheelchair)?: A Little Help needed standing up from a chair using your arms (e.g., wheelchair or bedside chair)?: A Little Help needed to walk in hospital room?: A Little Help needed climbing 3-5 steps with a railing? : A Little 6 Click Score: 18    End of Session   Activity Tolerance: Patient limited by fatigue Patient left: in chair;with call bell/phone within reach;with family/visitor present Nurse Communication: Mobility status PT Visit Diagnosis: Other abnormalities of gait and mobility (R26.89);Muscle weakness (generalized) (M62.81)     Time: 7425-9563 PT Time Calculation (min) (ACUTE ONLY): 32 min  Charges:  $Gait Training: 8-22 mins $Therapeutic Exercise: 8-22 mins                     Zenaida Niece, PT, DPT Acute Rehabilitation Pager: [phone] Office [place] 04/14/2021, 12:45 PM

## 2021-04-14 NOTE — TOC Benefit Eligibility Note (Signed)
Transition of Care [facility]) Benefit Eligibility Note    Patient Details  Name: Rykker Coviello MRN: [id number] Date of Birth: 11/15/1945   Medication/Dose: ELIQUIS  2.5 MG BID : COVER-YES , CO-PAY- $ 47.00 , TIER 3 DRUG  , P/A-NO   and  ELIQUIS  5 MG BID: COVER- YES , CO-PAUY- $ 47.00, TIER- 3 DRUG   ,P/A- NO  Covered?: Yes  Tier: 3 Drug  Prescription Coverage Preferred Pharmacy: [place] with Person/Company/Phone Number:: [facility]  @ [employer] RX # [phone]  Co-Pay: $ 47.00  Prior Approval: No  Deductible: Met (OUT-OF-POCKET:UNMET)  Additional Notes: ELIQUIS  10 MG BID : Crecencio Mc Phone Number: 04/14/2021, 2:59 PM

## 2021-04-14 NOTE — Progress Notes (Signed)
Occupational Therapy Treatment Patient Details Name: Christopher Francis MRN: [id number] DOB: 1945/10/02 Today's Date: 04/14/2021   History of present illness 76 y.o. male past medical history of hypertension, recent GI symptoms reported by family.  patient had an unresponsive event with a fall in the bathroom.  EMS was called and witnessed what they described as 2 seizures.   OT comments  Patient with good progress this date, improved mobility, right upper extremity AROM, and improved independence with ADL completion.  Patient O2 ranging from 88% to 94% on 1 L via [place].  Able to complete ADL from sit/stand level with setup and generalized supervision.  Occasional Min [place] for mobility due to lines and leads.  OT can continue efforts in the acute setting, and [facility] OT can be considered if he agrees.      Recommendations for follow up therapy are one component of a multi-disciplinary discharge planning process, led by the attending physician.  Recommendations may be updated based on patient status, additional functional criteria and insurance authorization.    Follow Up Recommendations  Home health OT    Assistance Recommended at Discharge Intermittent Supervision/Assistance  Patient can return home with the following      Equipment Recommendations  None recommended by OT    Recommendations for Other Services      Precautions / Restrictions Precautions Precautions: Fall Precaution Comments: monitor SpO2 Restrictions Weight Bearing Restrictions: No       Mobility Bed Mobility Overal bed mobility: Modified Independent                  Transfers Overall transfer level: Needs assistance Equipment used: None Transfers: Sit to/from Stand Sit to Stand: Supervision     Step pivot transfers: Supervision           Balance Overall balance assessment: Needs assistance Sitting-balance support: Feet supported, No upper extremity supported Sitting balance-Leahy Scale: Good        Standing balance-Leahy Scale: Fair                             ADL either performed or assessed with clinical judgement   ADL       Grooming: Wash/dry hands;Wash/dry face;Oral care;Supervision/safety;Sitting;Standing   Upper Body Bathing: Supervision/ safety;Sitting;Standing   Lower Body Bathing: Supervison/ safety;Sit to/from stand   Upper Body Dressing : Supervision/safety;Sitting;Standing   Lower Body Dressing: Supervision/safety;Sit to/from stand   Toilet Transfer: Sales executive;Ambulation   Toileting- Clothing Manipulation and Hygiene: Supervision/safety;Sitting/lateral lean              Extremity/Trunk Assessment Upper Extremity Assessment RUE Deficits / Details: Improved AROM to R shoulder.. closer to 3+/5 RUE Sensation: WNL RUE Coordination: decreased fine motor;decreased gross motor   Lower Extremity Assessment Lower Extremity Assessment: Defer to PT evaluation   Cervical / Trunk Assessment Cervical / Trunk Assessment: Normal                      Cognition Arousal/Alertness: Awake/alert Behavior During Therapy: WFL for tasks assessed/performed Overall Cognitive Status: Within Functional Limits for tasks assessed                                                             Pertinent Vitals/ Pain  Pain Assessment Pain Assessment: No/denies pain                                                          Frequency  Min 2X/week        Progress Toward Goals  OT Goals(current goals can now be found in the care plan section)  Progress towards OT goals: Progressing toward goals  Acute Rehab OT Goals OT Goal Formulation: With patient Time For Goal Achievement: 04/27/21 Potential to Achieve Goals: Good  Plan Discharge plan remains appropriate    Co-evaluation                 AM-PAC OT "6 Clicks" Daily Activity     Outcome Measure   Help from  another person eating meals?: None Help from another person taking care of personal grooming?: A Little Help from another person toileting, which includes using toliet, bedpan, or urinal?: A Little Help from another person bathing (including washing, rinsing, drying)?: A Little Help from another person to put on and taking off regular upper body clothing?: A Little Help from another person to put on and taking off regular lower body clothing?: A Little 6 Click Score: 19    End of Session Equipment Utilized During Treatment: Oxygen  OT Visit Diagnosis: Unsteadiness on feet (R26.81);Muscle weakness (generalized) (M62.81)   Activity Tolerance Patient tolerated treatment well   Patient Left in chair;with call bell/phone within reach;with family/visitor present   Nurse Communication          Time: 2248-2500 OT Time Calculation (min): 27 min  Charges: OT General Charges $OT Visit: 1 Visit OT Treatments $Self Care/Home Management : 23-37 mins  04/14/2021  RP, OTR/L  Acute Rehabilitation Services  Office:  [phone]   Metta Clines 04/14/2021, 10:24 AM

## 2021-04-14 NOTE — Progress Notes (Signed)
PROGRESS NOTE  Christopher Francis  DOB: 03-05-1945  PCP: [name], MD NGE:[id number]  DOA: 04/10/2021  LOS: 3 days  Hospital Day: 5  History of Present Illness / Brief narrative:  Christopher Francis is 76 yo M with past medical history of chest pain and seizures.  Patient presented to ED on 2/20 with AMS.   Per patient wife, patient was not feeling well from the night of 2/19.  He had some diarrhea, nausea and vomiting.  Next day he slept 5 to 6 hours.  Later in the evening, he was in the bathroom when the wife heard a thud.  She noticed him unresponsive and called EMS. EMS witnessed what they described as 2 seizures for which she was given IV Versed.  Per patient's wife, patient had 4 similar distinct episodes over the last 12 years with acute onset of severe abdominal pain, and diarrhea followed by syncope without any precipitating factors.  Previous episodes were not severe as this 1.  He is fine in between these episodes.  He was seen by GI as an outpatient in the past and work-up done with no clear cause identified.  He also had cardiology work-up by Dr. Burt Knack with no clear positive finding..  While in the ED, he was not responsive and questionably posturing.  He was emergently intubated. He had significant amount of dark secretions from his OG tube concerning for a GI bleed.  No history of stroke, GI bleed per family.    Labs with blood glucose level elevated to 256, creatinine 1.8, WBC 17. He was seen by neurology.  He was loaded on Keppra. He was admitted to ICU, he was extubated after 24 hours on 2/22 Transferred to [facility] on 2/24.  Subjective:  Seen and examined this morning.  Pleasant elderly Caucasian male.  Lying down in bed.  Not in distress.  Was getting ready to work with physical therapy.  On 2 L oxygen by nasal cannula at rest.  On ambulation, he desatted to 80s on room air and required 2 L again. CT angio of chest was obtained later in the morning was was positive for pulm  embolism.  Hospital Course:  Seizure Acute metabolic encephalopathy -Patient was found unresponsive next to the toilet seat.  His unresponsiveness was not directly witnessed.  His wife heard a thud.  Prior to the event, for 24 hours, patient had diarrhea and he slept multiple hours that day. -EMS witnessed 2 seizure-like activities for which he was given IV Versed. -In the ED he was not responsive and questionably posturing.  Intubated to protect airway. -No meningitic signs at the time of assessment in the ED by neurologist. -EEG negative for seizures. -Mental status improved significantly in 24 hours. -CT of head and neck did not show any large vessel occlusion or stenosis. -History and UDS negative for substance abuse. -MRI brain on 2/23 did not show any acute finding but showed remote right parietal cortex infarct. 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  He probably had lowered seizure threshold in the context of intercurrent illness. -Neurology recommended Keppra 500 mg twice daily.  Patient will follow-up with neurology as an outpatient. -Patient has been counseled of seizure precautions post discharge.  Acute gastroenteritis Coffee-ground emesis -Presented with diarrhea for more than 24 hours.   -CT scan of abdomen pelvis on admission showed a lot of fluid in the stomach, esophagus, small bowel consistent with  acute gastroenteritis with no evidence of bowel obstruction. -Stool studies sent on 2/23 resulted today showing positive for norovirus.  Patient is still having loose bowel movement but less frequent than before. -Per initial report, OG tube suction on admission had some coffee-ground emesis.  Hemoglobin mostly stable.  Currently on PPI twice daily. -Currently tolerating regular diet.  Follow-up with GI as an outpatient.  Hypovolemic shock -Likely due to excessive diarrhea loss. Patient had  lactic acid elevated to 4.7 on presentation.  He also required Neo-Synephrine drip for more than 24 hours.  Hemodynamically stabilized with IV hydration.. WBC count and lactic acid level improved without antibiotics.    Acute pulmonary embolism -CT angio chest done today 2/24 showed acute pulm embolism in both lungs with small to moderate thrombus burden.  Right ventricular cavity slightly larger than the left suggestive of RV strain. -PCCM consult appreciated.  Started on Eliquis. -Obtain echocardiogram and ultrasound duplex of lower extremities.  Acute hypoxic respiratory failure Aspiration pneumonitis -Multifactorial : Pulm embolism, aspiration pneumonitis.   -CT scan of chest from 2/20 showed a complete filling of the right bronchus intermedius with segmental occlusion of the right middle and lower lobe bronchi.  Right upper lobe and left-sided bronchial thickening with areas of mucous plugging. -CT angio chest 224 positive for pulm embolism -Patient initially required intubation.  Extubated in 24 hours.  He however continues to require supplemental oxygen at 1 to 2 L.  AKI Non anion gap metabolic acidosis -Creatinine peaked at 2.19 on 2/21.  Gradually improved with hydration.  Was on HCTZ/lisinopril.  Both were held. -Creatinine back to normal now.  Serum bicarb was also low at presentation, back to normal now. Recent Labs    04/10/21 2227 04/10/21 2230 04/11/21 0420 04/11/21 0659 04/11/21 1402 04/12/21 0531 04/13/21 0053 04/13/21 0650 04/14/21 0724  BUN 21 22 23  25* 23 30* 27* 26* 25*  CREATININE 1.76* 1.80* 2.04* 2.19* 1.91* 1.82* 1.42* 1.32* 1.17  CO2 17*  --  20* 20* 30 23 25 26 23    History of hypertension -Prior to admission, patient was on lisinopril 10 mg daily.  Currently on hold.  Type 2 diabetes mellitus -A1c 6.4 on 04/11/2021.  Last A1c before that was 6.7 in July 2022. -Diet controlled Recent Labs  Lab 04/13/21 1925 04/13/21 2309 04/14/21 0308  04/14/21 0726 04/14/21 1111  GLUCAP 101* 110* 101* 122* 128*   Fatty liver -CT today shows fatty infiltration of liver -Obtain lipid panel labs tomorrow.     Component Value Date/Time   TRIG 39 04/11/2021 0420   Mobility: Encourage ambulation Goals of care   Code Status: Full Code   Nutritional status:  Body mass index is 29.17 kg/m.      Diet:  Diet Order             Diet regular Room service appropriate? Yes; Fluid consistency: Thin  Diet effective now                   DVT prophylaxis:  Place and maintain sequential compression device Start: 04/13/21 1714 apixaban (ELIQUIS) tablet 10 mg  apixaban (ELIQUIS) tablet 5 mg   Antimicrobials: None Fluid: None Consultants: PCCM Family Communication: Discussed with patient's wife  Status is: Inpatient  Continue in-hospital care because: New pulm embolism, improving diarrhea Level of care: ICU   Dispo: The patient is from: Home              Anticipated d/c is to: Home in 1 to  2 days              Patient currently is not medically stable to d/c.   Difficult to place patient No     Infusions:   sodium chloride      Scheduled Meds:  apixaban  10 mg Oral BID   Followed by   Derrill Memo ON 04/21/2021] apixaban  5 mg Oral BID   Chlorhexidine Gluconate Cloth  6 each Topical Daily   insulin aspart  0-15 Units Subcutaneous TID WC   levETIRAcetam  500 mg Oral BID    PRN meds: benzonatate, guaiFENesin-dextromethorphan, melatonin   Antimicrobials: Anti-infectives (From admission, onward)    Start     Dose/Rate Route Frequency Ordered Stop   04/11/21 0600  piperacillin-tazobactam (ZOSYN) IVPB 3.375 g  Status:  Discontinued        3.375 g 12.5 mL/hr over 240 Minutes Intravenous Every 8 hours 04/11/21 0021 04/13/21 1137   04/11/21 0030  piperacillin-tazobactam (ZOSYN) IVPB 3.375 g        3.375 g 100 mL/hr over 30 Minutes Intravenous  Once 04/11/21 0021 04/11/21 0201       Objective: Vitals:   04/14/21 1000  04/14/21 1200  BP: (!) 153/80   Pulse: 86   Resp: (!) 27   Temp:  99 F (37.2 C)  SpO2: 92% 91%    Intake/Output Summary (Last 24 hours) at 04/14/2021 1424 Last data filed at 04/14/2021 1400 Gross per 24 hour  Intake 720 ml  Output 2300 ml  Net -1580 ml   Filed Weights   04/11/21 0500 04/12/21 0353 04/14/21 0500  Weight: 84.8 kg 89.4 kg 88.3 kg   Weight change:  Body mass index is 29.17 kg/m.   Physical Exam: General exam: Pleasant, elderly Caucasian male.  Not in physical distress Skin: No rashes, lesions or ulcers. HEENT: Atraumatic, normocephalic, no obvious bleeding Lungs: Clear to auscultation bilaterally CVS: Regular rate and rhythm, no murmur GI/Abd soft, nontender, nondistended, bowel sound present CNS: Alert, awake oriented x3 Psychiatry: Mood appropriate Extremities: No pedal edema, no calf tenderness  Data Review: I have personally reviewed the laboratory data and studies available.  F/u labs ordered Unresulted Labs (From admission, onward)     Start     Ordered   04/15/21 0500  CBC with Differential/Platelet  Daily,   R     Question:  Specimen collection method  Answer:  Lab=Lab collect   04/14/21 1421   04/15/21 1610  Basic metabolic panel  Daily,   R     Question:  Specimen collection method  Answer:  Lab=Lab collect   04/14/21 1421   04/15/21 0500  Magnesium  Tomorrow morning,   STAT       Question:  Specimen collection method  Answer:  Lab=Lab collect   04/14/21 1421   04/15/21 0500  Phosphorus  Tomorrow morning,   R       Question:  Specimen collection method  Answer:  Lab=Lab collect   04/14/21 1421   04/15/21 0500  Lipid panel  Tomorrow morning,   R       Question:  Specimen collection method  Answer:  Lab=Lab collect   04/14/21 1421   04/15/21 0500  Procalcitonin  Daily,   R     Question:  Specimen collection method  Answer:  Lab=Lab collect   04/14/21 1423   04/14/21 1424  Procalcitonin - Baseline  Add-on,   AD       Question:  Specimen  collection  method  Answer:  Lab=Lab collect   04/14/21 1423   04/11/21 1735  Occult blood card to lab, stool RN will collect  Once,   R       Question:  Specimen to be collected by:  Answer:  RN will collect   04/11/21 1735            Signed, Terrilee Croak, MD [facility] 04/14/2021

## 2021-04-14 NOTE — Progress Notes (Signed)
NAME:  Christopher Francis, MRN:  [id number], DOB:  Oct 18, 1945, LOS: 3 ADMISSION DATE:  04/10/2021, CONSULTATION DATE:  04/11/21 REFERRING MD:  Billy Fischer, CHIEF COMPLAINT:  AMS, seizures   History of Present Illness:  Christopher Francis is 76 yo M with past medical history of chest pain and seizures.  He presented to [facility] ED 2/20 with AMS.  He had GI symptoms that started 24 hour prior to presentation and he was noted to sleep 5-6 hrs during day which is unusual for him.  His wife stated that he got up to go to bathroom and she heard a thud then found him unresponsive in bathroom. EMS called and patient noted to have 2 seizures during transport.  He was intubated on arrival to ED and OG tube place with large amount of dark coffee-ground like output, 750 cc.  Seen by neuro in ED. Loaded with Keppra and EEG started.  Pertinent  Medical History  Chest pain Seizures  Significant Hospital Events: Including procedures, antibiotic start and stop dates in addition to other pertinent events   2/20 found unresponsive on floor at home, intubated 2/21 intubated, sedate, pressors needed 2/22 extubated 2/24 patient sensorium is clear ambulating but desaturating to 80s.  Interim History / Subjective:   Eager to go home.  Continues to have persistent hypoxia despite diuresis yesterday.  Desaturated to 87% with ambulation.  Objective   Blood pressure (!) 153/80, pulse 86, temperature 99 F (37.2 C), temperature source Oral, resp. rate (!) 27, height 5' 8.5" (1.74 m), weight 88.3 kg, SpO2 92 %.        Intake/Output Summary (Last 24 hours) at 04/14/2021 1239 Last data filed at 04/14/2021 0500 Gross per 24 hour  Intake 720 ml  Output 1350 ml  Net -630 ml    Filed Weights   04/11/21 0500 04/12/21 0353 04/14/21 0500  Weight: 84.8 kg 89.4 kg 88.3 kg   Examination: General appearance: well-developed, in no acute distress Eyes: anicteric sclerae, moist conjunctivae; no lid-lag; PERRLA, tracking  appropriately HENT: NCAT; oropharynx, MMM, no mucosal ulcerations; normal hard and soft palate Neck: Trachea midline; FROM, supple, no lymphadenopathy, no JVD Lungs: CTAB, no crackles, no wheeze, with normal respiratory effort and no intercostal retractions CV: RRR, S1, S2, no MRGs  Abdomen: Soft, non-tender; non-distended, BS present  Extremities: No peripheral edema, radial and DP pulses present bilaterally  Skin: Normal temperature, turgor and texture; no rash Psych: Appropriate affect Neuro: Alert and oriented to person and place, no focal deficit    Labs/imaging personally reviewed: Creatinine 1.17 BUN 30->26, GFR 38-> 56 Hgb 10.5 Platelets 130 MRI brain negative C diff negative GI panel positive for norovirus CT PE demonstrates pulmonary embolism with borderline RV strain  Resolved Hospital Problem list   NAGMA Metabolic acidosis Acute metabolic encephalopathy Possible Seizure  Leukocytosis Acute hypoxic respiratory failure  Assessment & Plan:   Subacute pulmonary embolism Right heart strain Diarrheal illness secondary to norovirus Resolved AKI Aspiration pneumonitis with mucous plugging now resolved Prediabetes Possible seizure  Plan:  -Start apixaban for subacute pulmonary embolism, likely occurred a few days ago already. -Echocardiogram to evaluate right heart strain -Given hemodynamic stability unlikely to require intervention -Enteral precautions for norovirus -Continue Keppra -Can still transfer to floor.  Best Practice (right click and "Reselect all SmartList Selections" daily)   Diet/type: regualar DVT prophylaxis: other GI prophylaxis: PPI Lines: N/A Foley:  N/A Code Status:  full code Last date of multidisciplinary goals of care discussion [wife and patient updated at  bedside 2/23]  Kipp Brood, MD [facility] ICU Physician [place]  Pager: [phone] Or Epic Secure Chat After hours: [phone].  04/14/2021, 12:45  PM

## 2021-04-14 NOTE — Progress Notes (Signed)
ANTICOAGULATION CONSULT NOTE - Initial Consult  Pharmacy Consult for Eliquis Indication: pulmonary embolus  Allergies  Allergen Reactions   Tape Other (See Comments)    Adhesive tape caused blisters    Patient Measurements: Height: 5' 8.5" (174 cm) Weight: 88.3 kg (194 lb 10.7 oz) IBW/kg (Calculated) : 69.55  Vital Signs: Temp: 99 F (37.2 C) (02/24 1200) Temp Source: Oral (02/24 1200) BP: 153/80 (02/24 1000) Pulse Rate: 86 (02/24 1000)  Labs: Recent Labs    04/12/21 0531 04/13/21 0053 04/13/21 0650 04/14/21 0724  HGB 11.8*  --  9.8* 10.5*  HCT 36.2*  --  30.5* 32.2*  PLT 170  --  123* 130*  CREATININE 1.82* 1.42* 1.32* 1.17    Estimated Creatinine Clearance: 59.5 mL/min (by C-G formula based on SCr of 1.17 mg/dL).   Medical History: Past Medical History:  Diagnosis Date   Gastroesophageal reflux disease    Hemorrhoids    Hypertension     Assessment: 7 YOM presenting with confusion, abd pain and diarrhea, now extubated.  Dark secretions from OG and initial concern for GIB, improved with no clear etiology, CBC low but stable, plts 130.  Still requiring oxygen and CT angio today showing PE  Goal of Therapy:  Monitor platelets by anticoagulation protocol: Yes   Plan:  Eliquis 10mg  PO BID x 7d, then 5mg  PO BID thereafter Monitor s/s bleeding  Bertis Ruddy, PharmD Clinical Pharmacist ED Pharmacist Phone # [phone] 04/14/2021 12:43 PM

## 2021-04-14 NOTE — Progress Notes (Signed)
[facility] Progress Note Patient Name: Christopher Francis DOB: November 23, 1945 MRN: [id number]   Date of Service  04/14/2021  HPI/Events of Note  Pt on commode, struggling to have BM.  Unknown last BM or if he received softeners/laxatives.  RN asking for orders  eICU Interventions  Colace ordered.      Intervention Category Minor Interventions: Other: (constipation)  Elmer Sow 04/14/2021, 1:34 AM

## 2021-04-14 NOTE — Progress Notes (Signed)
Subjective: Lying in bed comfortably.   Objective: Current vital signs: BP 127/74    Pulse 79    Temp 98.8 F (37.1 C) (Oral)    Resp (!) 28    Ht 5' 8.5" (1.74 m)    Wt 88.3 kg    SpO2 98%    BMI 29.17 kg/m  Vital signs in last 24 hours: Temp:  [97.6 F (36.4 C)-98.8 F (37.1 C)] 98.8 F (37.1 C) (02/24 0400) Pulse Rate:  [63-93] 79 (02/24 0600) Resp:  [17-32] 28 (02/24 0600) BP: (114-149)/(48-107) 127/74 (02/24 0600) SpO2:  [84 %-100 %] 98 % (02/24 0600) Weight:  [88.3 kg] 88.3 kg (02/24 0500)  Intake/Output from previous day: 02/23 0701 - 02/24 0700 In: 1451.3 [P.O.:720; I.V.:556; IV Piggyback:175.2] Out: 1350 [Urine:1350] Intake/Output this shift: No intake/output data recorded. Nutritional status:  Diet Order             Diet regular Room service appropriate? Yes; Fluid consistency: Thin  Diet effective now                  HEENT: [place]/AT Lungs: Occasional cough is noted Ext: No edema  Neurologic Exam: Ment: Awake and alert. Oriented x 5. Speech is fluent with intact comprehension and naming.  CN: Temporal visual fields intact with no extinction to DSS. Fixates and tracks normally. EOMI. Face symmetric. Phonation intact. Tongue protrudes midline. Motor:  LUE 5/5 RUE 4/5 BLE 5/5 Pronator drift on the right Sensory: Intact to FT x 4. No extinction to DSS.  Reflexes: 2+ left BR and biceps. 1+ right BR and biceps. Bilateral patellae 2+. Cerebellar: No ataxia with FNF bilaterally, but slow and mildly dysmetric on the right.  Gait: Deferred  Lab Results: Results for orders placed or performed during the hospital encounter of 04/10/21 (from the past 48 hour(s))  Glucose, capillary     Status: Abnormal   Collection Time: 04/12/21  7:47 AM  Result Value Ref Range   Glucose-Capillary 102 (H) 70 - 99 mg/dL    Comment: Glucose reference range applies only to samples taken after fasting for at least 8 hours.  Occult bld gastric/duodenum (cup to lab)     Status:  Abnormal   Collection Time: 04/12/21  9:04 AM  Result Value Ref Range   pH, Gastric NOT DONE    Occult Blood, Gastric POSITIVE (A) NEGATIVE    Comment: Performed at [facility] [address] [place], [place] [address]  Glucose, capillary     Status: Abnormal   Collection Time: 04/12/21 11:11 AM  Result Value Ref Range   Glucose-Capillary 111 (H) 70 - 99 mg/dL    Comment: Glucose reference range applies only to samples taken after fasting for at least 8 hours.  Glucose, capillary     Status: Abnormal   Collection Time: 04/12/21  3:44 PM  Result Value Ref Range   Glucose-Capillary 107 (H) 70 - 99 mg/dL    Comment: Glucose reference range applies only to samples taken after fasting for at least 8 hours.  Glucose, capillary     Status: None   Collection Time: 04/12/21  7:29 PM  Result Value Ref Range   Glucose-Capillary 91 70 - 99 mg/dL    Comment: Glucose reference range applies only to samples taken after fasting for at least 8 hours.  Glucose, capillary     Status: None   Collection Time: 04/12/21 11:27 PM  Result Value Ref Range   Glucose-Capillary 72 70 - 99 mg/dL  Comment: Glucose reference range applies only to samples taken after fasting for at least 8 hours.  Basic metabolic panel     Status: Abnormal   Collection Time: 04/13/21 12:53 AM  Result Value Ref Range   Sodium 140 135 - 145 mmol/L   Potassium 3.9 3.5 - 5.1 mmol/L   Chloride 105 98 - 111 mmol/L   CO2 25 22 - 32 mmol/L   Glucose, Bld 86 70 - 99 mg/dL    Comment: Glucose reference range applies only to samples taken after fasting for at least 8 hours.   BUN 27 (H) 8 - 23 mg/dL   Creatinine, Ser 1.42 (H) 0.61 - 1.24 mg/dL   Calcium 8.3 (L) 8.9 - 10.3 mg/dL   GFR, Estimated 52 (L) >60 mL/min    Comment: (NOTE) Calculated using the CKD-EPI Creatinine Equation (2021)    Anion gap 10 5 - 15    Comment: Performed at [facility] [address] [place], [place] [address]  Magnesium     Status: None    Collection Time: 04/13/21 12:53 AM  Result Value Ref Range   Magnesium 2.2 1.7 - 2.4 mg/dL    Comment: Performed at [facility] [address] [place], [place] [address]  Glucose, capillary     Status: Abnormal   Collection Time: 04/13/21  3:10 AM  Result Value Ref Range   Glucose-Capillary 128 (H) 70 - 99 mg/dL    Comment: Glucose reference range applies only to samples taken after fasting for at least 8 hours.  C Difficile Quick Screen w PCR reflex     Status: None   Collection Time: 04/13/21  5:19 AM   Specimen: STOOL  Result Value Ref Range   C Diff antigen NEGATIVE NEGATIVE   C Diff toxin NEGATIVE NEGATIVE   C Diff interpretation No C. difficile detected.     Comment: Performed at [facility], [place] [address] [place], [place] [address]  Basic metabolic panel     Status: Abnormal   Collection Time: 04/13/21  6:50 AM  Result Value Ref Range   Sodium 140 135 - 145 mmol/L   Potassium 3.9 3.5 - 5.1 mmol/L   Chloride 106 98 - 111 mmol/L   CO2 26 22 - 32 mmol/L   Glucose, Bld 95 70 - 99 mg/dL    Comment: Glucose reference range applies only to samples taken after fasting for at least 8 hours.   BUN 26 (H) 8 - 23 mg/dL   Creatinine, Ser 1.32 (H) 0.61 - 1.24 mg/dL   Calcium 8.4 (L) 8.9 - 10.3 mg/dL   GFR, Estimated 56 (L) >60 mL/min    Comment: (NOTE) Calculated using the CKD-EPI Creatinine Equation (2021)    Anion gap 8 5 - 15    Comment: Performed at [facility] [address] [place], [place] [address]  CBC     Status: Abnormal   Collection Time: 04/13/21  6:50 AM  Result Value Ref Range   WBC 5.7 4.0 - 10.5 K/uL   RBC 3.62 (L) 4.22 - 5.81 MIL/uL   Hemoglobin 9.8 (L) 13.0 - 17.0 g/dL   HCT 30.5 (L) 39.0 - 52.0 %   MCV 84.3 80.0 - 100.0 fL   MCH 27.1 26.0 - 34.0 pg   MCHC 32.1 30.0 - 36.0 g/dL   RDW 14.3 11.5 - 15.5 %   Platelets 123 (L) 150 - 400 K/uL   nRBC 0.0 0.0 -  0.2 %    Comment: Performed at [facility], [place] [address],  [place], [place] [address]  Glucose, capillary     Status: None   Collection Time: 04/13/21  8:02 AM  Result Value Ref Range   Glucose-Capillary 95 70 - 99 mg/dL    Comment: Glucose reference range applies only to samples taken after fasting for at least 8 hours.  Glucose, capillary     Status: None   Collection Time: 04/13/21 12:31 PM  Result Value Ref Range   Glucose-Capillary 86 70 - 99 mg/dL    Comment: Glucose reference range applies only to samples taken after fasting for at least 8 hours.  Glucose, capillary     Status: None   Collection Time: 04/13/21  4:16 PM  Result Value Ref Range   Glucose-Capillary 87 70 - 99 mg/dL    Comment: Glucose reference range applies only to samples taken after fasting for at least 8 hours.  Glucose, capillary     Status: Abnormal   Collection Time: 04/13/21  7:25 PM  Result Value Ref Range   Glucose-Capillary 101 (H) 70 - 99 mg/dL    Comment: Glucose reference range applies only to samples taken after fasting for at least 8 hours.  Glucose, capillary     Status: Abnormal   Collection Time: 04/13/21 11:09 PM  Result Value Ref Range   Glucose-Capillary 110 (H) 70 - 99 mg/dL    Comment: Glucose reference range applies only to samples taken after fasting for at least 8 hours.  Glucose, capillary     Status: Abnormal   Collection Time: 04/14/21  3:08 AM  Result Value Ref Range   Glucose-Capillary 101 (H) 70 - 99 mg/dL    Comment: Glucose reference range applies only to samples taken after fasting for at least 8 hours.  Glucose, capillary     Status: Abnormal   Collection Time: 04/14/21  7:26 AM  Result Value Ref Range   Glucose-Capillary 122 (H) 70 - 99 mg/dL    Comment: Glucose reference range applies only to samples taken after fasting for at least 8 hours.    Recent Results (from the past 240 hour(s))  Resp Panel by RT-PCR (Flu A&B, Covid) Nasopharyngeal Swab     Status: None   Collection Time: 04/10/21 10:30 PM   Specimen: Nasopharyngeal Swab;  Nasopharyngeal(NP) swabs in vial transport medium  Result Value Ref Range Status   SARS Coronavirus 2 by RT PCR NEGATIVE NEGATIVE Final    Comment: (NOTE) SARS-CoV-2 target nucleic acids are NOT DETECTED.  The SARS-CoV-2 RNA is generally detectable in upper respiratory specimens during the acute phase of infection. The lowest concentration of SARS-CoV-2 viral copies this assay can detect is 138 copies/mL. A negative result does not preclude SARS-Cov-2 infection and should not be used as the sole basis for treatment or other patient management decisions. A negative result may occur with  improper specimen collection/handling, submission of specimen other than nasopharyngeal swab, presence of viral mutation(s) within the areas targeted by this assay, and inadequate number of viral copies(<138 copies/mL). A negative result must be combined with clinical observations, patient history, and epidemiological information. The expected result is Negative.  Fact Sheet for Patients:  EntrepreneurPulse.com.au  Fact Sheet for Healthcare Providers:  IncredibleEmployment.be  This test is no t yet approved or cleared by the [place] FDA and  has been authorized for detection and/or diagnosis of SARS-CoV-2 by FDA under an Emergency Use Authorization (EUA). This EUA will  remain  in effect (meaning this test can be used) for the duration of the COVID-19 declaration under Section 564(b)(1) of the Act, 21 U.S.C.section 360bbb-3(b)(1), unless the authorization is terminated  or revoked sooner.       Influenza A by PCR NEGATIVE NEGATIVE Final   Influenza B by PCR NEGATIVE NEGATIVE Final    Comment: (NOTE) The Xpert Xpress SARS-CoV-2/FLU/RSV plus assay is intended as an aid in the diagnosis of influenza from Nasopharyngeal swab specimens and should not be used as a sole basis for treatment. Nasal washings and aspirates are unacceptable for Xpert Xpress  SARS-CoV-2/FLU/RSV testing.  Fact Sheet for Patients: EntrepreneurPulse.com.au  Fact Sheet for Healthcare Providers: IncredibleEmployment.be  This test is not yet approved or cleared by the [place] FDA and has been authorized for detection and/or diagnosis of SARS-CoV-2 by FDA under an Emergency Use Authorization (EUA). This EUA will remain in effect (meaning this test can be used) for the duration of the COVID-19 declaration under Section 564(b)(1) of the Act, 21 U.S.C. section 360bbb-3(b)(1), unless the authorization is terminated or revoked.  Performed at [facility], [place] [address] [place], [place] [address]   MRSA Next Gen by PCR, Nasal     Status: None   Collection Time: 04/11/21  1:35 AM   Specimen: Nasal Mucosa; Nasal Swab  Result Value Ref Range Status   MRSA by PCR Next Gen NOT DETECTED NOT DETECTED Final    Comment: (NOTE) The GeneXpert MRSA Assay (FDA approved for NASAL specimens only), is one component of a comprehensive MRSA colonization surveillance program. It is not intended to diagnose MRSA infection nor to guide or monitor treatment for MRSA infections. Test performance is not FDA approved in patients less than 14 years old. Performed at [facility], [place] [address] [place], [place] [address]   Culture, blood (Routine X 2) w Reflex to ID Panel     Status: None (Preliminary result)   Collection Time: 04/11/21  3:48 PM   Specimen: BLOOD LEFT HAND  Result Value Ref Range Status   Specimen Description BLOOD LEFT HAND  Final   Special Requests   Final    BOTTLES DRAWN AEROBIC AND ANAEROBIC Blood Culture adequate volume   Culture   Final    NO GROWTH 2 DAYS Performed at [facility], [place] [address], [place], [place] [address]    Report Status PENDING  Incomplete  Culture, blood (Routine X 2) w Reflex to ID Panel     Status: None (Preliminary result)   Collection Time: 04/11/21  4:00 PM    Specimen: BLOOD  Result Value Ref Range Status   Specimen Description BLOOD SITE NOT SPECIFIED  Final   Special Requests   Final    BOTTLES DRAWN AEROBIC AND ANAEROBIC Blood Culture adequate volume   Culture   Final    NO GROWTH 2 DAYS Performed at [facility], [place] [address], [place], [place] [address]    Report Status PENDING  Incomplete  C Difficile Quick Screen w PCR reflex     Status: None   Collection Time: 04/13/21  5:19 AM   Specimen: STOOL  Result Value Ref Range Status   C Diff antigen NEGATIVE NEGATIVE Final   C Diff toxin NEGATIVE NEGATIVE Final   C Diff interpretation No C. difficile detected.  Final    Comment: Performed at [facility], [place] [address], [place], [place] [address]    Lipid Panel No  results for input(s): CHOL, TRIG, HDL, CHOLHDL, VLDL, LDLCALC in the last 72 hours.  Studies/Results: MR BRAIN W WO CONTRAST  Result Date: 04/13/2021 CLINICAL DATA:  New onset seizure with no history of trauma. Found unresponsive EXAM: MRI HEAD WITHOUT AND WITH CONTRAST TECHNIQUE: Multiplanar, multiecho pulse sequences of the brain and surrounding structures were obtained without and with intravenous contrast. CONTRAST:  88mL GADAVIST GADOBUTROL 1 MMOL/ML IV SOLN COMPARISON:  Head CT from 3 days ago FINDINGS: Brain: No acute infarction, hemorrhage, hydrocephalus, extra-axial collection or mass lesion. 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 Less than typical small-vessel ischemic changes in the hemispheric white matter. Age normal brain volume. Unremarkable appearance of the bilateral hippocampus. Vascular: Preserved flow voids Skull and upper cervical spine: Normal marrow signal Sinuses/Orbits: Patchy sinus opacification greatest at the bilateral sphenoids. Negative orbits. IMPRESSION: 1. No acute finding. 2. Remote right parietal cortex infarct. 3. Sinus inflammation greatest at the bilateral sphenoid where there has been progression since CT 3 days ago.  Electronically Signed   By: Jorje Guild M.D.   On: 04/13/2021 10:32    Medications: Scheduled:  Chlorhexidine Gluconate Cloth  6 each Topical Daily   enoxaparin (LOVENOX) injection  40 mg Subcutaneous Q24H   insulin aspart  0-15 Units Subcutaneous TID WC   Continuous:  sodium chloride      Assessment: 76 year old male with a PMHx of hypertension brought in on Monday night after he was found unresponsive right next to the toilet seat. He was noted to be unresponsive after event in the bathroom which was not directly witnessed; his wife had heard a thud. Last known well sometime yesterday when he started to have some diarrhea and other GI symptoms and remained isolated from family and slept multiple hours on the day of admission which is unlike him. EMS witnessed what they described as 2 seizures for which she was given IV Versed. On arrival to the ED he was not responsive and questionably posturing. He was emergently intubated-paralytics were used.  He had significant amount of dark secretions from his OG tube concerning for a GI bleed but no reported GI bleed per family. Loaded with Keppra 1 g IV in the ED and was started on scheduled dosing. - No meningitic signs at time of ED assessment. He did have some leukocytosis which is now resolved. - Exam today is significantly improved.  - Spot EEG negative for seizures.  Generalized slowing only.  - CTA of head and neck: No intracranial large vessel occlusion or significant stenosis. No hemodynamically significant stenosis in the neck. The right vertebral artery appears congenitally iminutive, given the size of the vertebral artery foramen. - UDS was negative except for benzodiazepines. EtOH < 10. Salicylate < 7. - CT-head no acute changes.  Small area of right parietal encephalomalacia which was also mentioned on the 2014 CT head report in care everywhere. - CTA chest abdomen pelvis with no evidence of aortic dissection. There are findings  suspicious for aspiration.  - MRI brain: No acute finding. Remote right parietal cortex infarct. Sinus inflammation greatest at the bilateral sphenoid where there has been progression since CT 3 days ago. - Most likely component of the DDx is seizure due to lowered seizure thresholdi in the context of intercurrent illness. 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 Also with possible overlapping toxic metabolic encephalopathy.   Recommendations: - Continue Keppra 500 mg twice daily. Was briefly discontinued yesterday. Have  restarted as PO.  - Will need to follow up with Neurology as an outpatient.  - Inpatient seizure precautions - Outpatient seizure precautions: Per [facility] statutes, patients with seizures are not allowed to drive until  they have been seizure-free for six months. Use caution when using heavy equipment or power tools. Avoid working on ladders or at heights. Take showers instead of baths. Ensure the water temperature is not too high on the home water heater. Do not go swimming alone. When caring for infants or small children, sit down when holding, feeding, or changing them to minimize risk of injury to the child in the event you have a seizure. Also, Maintain good sleep hygiene. Avoid alcohol. - Neurohospitalist service will sign off. Please call if there are additional questions.     LOS: 3 days   @Electronically  signed: Dr. Kerney Elbe 04/14/2021  7:40 AM

## 2021-04-15 LAB — GLUCOSE, CAPILLARY
Glucose-Capillary: 139 mg/dL — ABNORMAL HIGH (ref 70–99)
Glucose-Capillary: 113 mg/dL — ABNORMAL HIGH (ref 70–99)
Glucose-Capillary: 117 mg/dL — ABNORMAL HIGH (ref 70–99)

## 2021-04-15 LAB — CBC WITH DIFFERENTIAL/PLATELET
Abs Immature Granulocytes: 0.05 10*3/uL (ref 0.00–0.07)
Basophils Absolute: 0 10*3/uL (ref 0.0–0.1)
Basophils Relative: 0 %
Eosinophils Absolute: 0.1 10*3/uL (ref 0.0–0.5)
Eosinophils Relative: 2 %
HCT: 33.7 % — ABNORMAL LOW (ref 39.0–52.0)
Hemoglobin: 11.5 g/dL — ABNORMAL LOW (ref 13.0–17.0)
Immature Granulocytes: 1 %
Lymphocytes Relative: 13 %
Lymphs Abs: 0.8 10*3/uL (ref 0.7–4.0)
MCH: 27.8 pg (ref 26.0–34.0)
MCHC: 34.1 g/dL (ref 30.0–36.0)
MCV: 81.6 fL (ref 80.0–100.0)
Monocytes Absolute: 0.6 10*3/uL (ref 0.1–1.0)
Monocytes Relative: 11 %
Neutro Abs: 4.3 10*3/uL (ref 1.7–7.7)
Neutrophils Relative %: 73 %
Platelets: 124 10*3/uL — ABNORMAL LOW (ref 150–400)
RBC: 4.13 MIL/uL — ABNORMAL LOW (ref 4.22–5.81)
RDW: 13.3 % (ref 11.5–15.5)
WBC: 5.9 10*3/uL (ref 4.0–10.5)
nRBC: 0 % (ref 0.0–0.2)

## 2021-04-15 LAB — BASIC METABOLIC PANEL
Anion gap: 12 (ref 5–15)
BUN: 22 mg/dL (ref 8–23)
CO2: 26 mmol/L (ref 22–32)
Calcium: 8.7 mg/dL — ABNORMAL LOW (ref 8.9–10.3)
Chloride: 101 mmol/L (ref 98–111)
Creatinine, Ser: 1.07 mg/dL (ref 0.61–1.24)
GFR, Estimated: >60 mL/min (ref >60)
Glucose, Bld: 134 mg/dL — ABNORMAL HIGH (ref 70–99)
Potassium: 3 mmol/L — ABNORMAL LOW (ref 3.5–5.1)
Sodium: 139 mmol/L (ref 135–145)

## 2021-04-15 LAB — PROCALCITONIN: Procalcitonin: 7.98 ng/mL

## 2021-04-15 LAB — LIPID PANEL
Cholesterol: 153 mg/dL (ref 0–200)
HDL: 23 mg/dL — ABNORMAL LOW (ref >40)
LDL Cholesterol: 95 mg/dL (ref 0–99)
Total CHOL/HDL Ratio: 6.7 RATIO
Triglycerides: 176 mg/dL — ABNORMAL HIGH (ref <150)
VLDL: 35 mg/dL (ref 0–40)

## 2021-04-15 LAB — PHOSPHORUS: Phosphorus: 4 mg/dL (ref 2.5–4.6)

## 2021-04-15 LAB — MAGNESIUM: Magnesium: 1.5 mg/dL — ABNORMAL LOW (ref 1.7–2.4)

## 2021-04-15 MED ORDER — APIXABAN (ELIQUIS) VTE STARTER PACK (10MG AND 5MG): ORAL_TABLET | ORAL | 0 refills | Status: DC

## 2021-04-15 MED ORDER — LEVETIRACETAM 500 MG PO TABS: 500.0000 mg | ORAL_TABLET | Freq: Two times a day (BID) | ORAL | 2 refills | Status: DC

## 2021-04-15 MED ORDER — POTASSIUM CHLORIDE CRYS ER 20 MEQ PO TBCR
40.0000 meq | EXTENDED_RELEASE_TABLET | Freq: Once | ORAL | Status: AC
  Administered 2021-04-15: 40 meq via ORAL
  Filled 2021-04-15: qty 2

## 2021-04-15 MED ORDER — PANTOPRAZOLE SODIUM 40 MG PO TBEC: 40.0000 mg | DELAYED_RELEASE_TABLET | Freq: Every day | ORAL | 2 refills | Status: DC

## 2021-04-15 MED ORDER — LISINOPRIL 10 MG PO TABS: 20.0000 mg | ORAL_TABLET | Freq: Every morning | ORAL | 2 refills | Status: DC

## 2021-04-15 MED ORDER — APIXABAN 5 MG PO TABS
ORAL_TABLET | ORAL | 0 refills | Status: DC
Start: 2021-04-15 — End: 2021-04-15

## 2021-04-15 MED ORDER — LISINOPRIL 10 MG PO TABS
10.0000 mg | ORAL_TABLET | Freq: Every day | ORAL | Status: DC
  Administered 2021-04-15: 10 mg via ORAL
  Filled 2021-04-15: qty 1

## 2021-04-15 MED ORDER — GUAIFENESIN ER 600 MG PO TB12: 600.0000 mg | ORAL_TABLET | Freq: Two times a day (BID) | ORAL | 0 refills | Status: AC

## 2021-04-15 MED ORDER — APIXABAN 5 MG PO TABS
ORAL_TABLET | ORAL | 0 refills | Status: DC
Start: 2021-04-15 — End: 2021-05-02

## 2021-04-15 MED ORDER — MAGNESIUM SULFATE 2 GM/50ML IV SOLN
2.0000 g | Freq: Once | INTRAVENOUS | Status: AC
  Administered 2021-04-15: 2 g via INTRAVENOUS
  Filled 2021-04-15: qty 50

## 2021-04-15 NOTE — Discharge Summary (Addendum)
Physician Discharge Summary  Christopher Francis AJO:[id number] DOB: 09/05/1945 DOA: 04/10/2021  PCP: [name], MD  Admit date: 04/10/2021 Discharge date: 04/15/2021  Admitted From: Home Discharge disposition: Home with oxygen  Recommendations at discharge:  Recommend compliance to medications including blood thinner. Monitor for any signs and symptoms of bleeding including blood in the vomitus, stool, dark stool, easy bruisability, worsening fatigue Follow-up with pulmonologist as an outpatient Follow-up with neurologist as an outpatient. Home oxygen arranged.  To use as needed.  History of Present Illness / Brief narrative:  Christopher Francis is 76 yo M with past medical history of chest pain and seizures.  Patient presented to ED on 2/20 with AMS.   Per patient wife, patient was not feeling well from the night of 2/19.  He had some diarrhea, nausea and vomiting.  Next day he slept 5 to 6 hours.  Later in the evening, he was in the bathroom when the wife heard a thud.  She noticed him unresponsive and called EMS. EMS witnessed what they described as 2 seizures for which she was given IV Versed.  Per patient's wife, patient had 4 similar distinct episodes over the last 12 years with acute onset of severe abdominal pain, and diarrhea followed by syncope without any precipitating factors.  Previous episodes were not severe as this 1.  He is fine in between these episodes.  He was seen by GI as an outpatient in the past and work-up done with no clear cause identified.  He also had cardiology work-up by Dr. Burt Knack with no clear positive finding..  While in the ED, he was not responsive and questionably posturing.  He was emergently intubated. He had significant amount of dark secretions from his OG tube concerning for a GI bleed.  No history of stroke, GI bleed per family.    Labs with blood glucose level elevated to 256, creatinine 1.8, WBC 17. He was seen by neurology.  He was loaded on  Keppra. He was admitted to ICU, he was extubated after 24 hours on 2/22 Transferred to [facility] on 2/24. Eventually, his stool tested positive for norovirus. He also was incidentally found to have acute bilateral lower extremities DVT and pulmonary embolism.  Subjective:  Seen and examined this morning.  Sitting up at the edge of the bed.  Not in distress.  On 2 L oxygen nasal cannula.  Son at bedside. Wife was on the phone.  Hospital Course:  Seizure Acute metabolic encephalopathy -Patient was found unresponsive next to the toilet seat.  His unresponsiveness was not directly witnessed.  His wife heard a thud.  Prior to the event, for 24 hours, patient had diarrhea and he slept multiple hours that day. -EMS witnessed 2 seizure-like activities for which he was given IV Versed. -In the ED he was not responsive and questionably posturing.  Intubated to protect airway. -No meningitic signs at the time of assessment in the ED by neurologist. -EEG negative for seizures. -Mental status improved significantly in 24 hours. -CT of head and neck did not show any large vessel occlusion or stenosis. -History and UDS negative for substance abuse. -MRI brain on 2/23 did not show any acute finding but showed remote right parietal cortex infarct. 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  He probably had lowered seizure threshold in the context of intercurrent illness. -Neurology recommended Keppra 500 mg twice daily.  Patient will follow-up with neurology  as an outpatient. -Patient has been counseled of seizure precautions post discharge.  Acute gastroenteritis Coffee-ground emesis -Presented with diarrhea for more than 24 hours.   -CT scan of abdomen pelvis on admission showed a lot of fluid in the stomach, esophagus, small bowel consistent with acute gastroenteritis with no evidence of bowel obstruction. -Stool studies sent on  2/23 resulted showing positive for norovirus.  Diarrhea improved.  Last bowel movement yesterday.  -Per initial report, OG tube suction on admission had shown some coffee-ground emesis.  Hemoglobin mostly stable.  Currently on PPI twice daily.  Continue PPI daily at home. -Currently tolerating regular diet.  Follow-up with GI as an outpatient.  Hypovolemic shock - POA - resolved -Likely due to excessive diarrhea loss. Patient had lactic acid elevated to 4.7 on presentation.  He also required Neo-Synephrine drip for more than 24 hours.  Hemodynamically stabilized with IV hydration.. WBC count and lactic acid level improved without antibiotics.    Acute pulmonary embolism -CT angio chest done 2/24 showed acute pulm embolism in both lungs with small to moderate thrombus burden.  Right ventricular cavity slightly larger than the left suggestive of RV strain.  Ultrasound duplex of bilateral lower extremities was positive for DVT. -PCCM consult appreciated.  Started on Eliquis. 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  His wife recalls an incident from last week when he was having more than expected shortness of breath and fatigue while working in the yard.  His echocardiogram showed moderately elevated pulmonary artery systolic pressure to 55. -I discussed the case with PCCM Dr. Doyne Keel Dr. Silas Flood.  Patient to follow-up with Dr. Kennith Gain as an outpatient.  Acute hypoxic respiratory failure Aspiration pneumonitis -Multifactorial : Pulm embolism, aspiration pneumonitis.   -CT scan of chest from 2/20 showed a complete filling of the right bronchus intermedius with segmental occlusion of the right middle and lower lobe bronchi.  Right upper lobe and left-sided bronchial thickening with areas of mucous plugging. -CT angio chest 224 positive for pulm embolism -Patient initially required intubation. Extubated in 24 hours.  He however continues to require  supplemental oxygen at 1 to 2 L.  Home oxygen ordered.  AKI Non anion gap metabolic acidosis -Creatinine peaked at 2.19 on 2/21.  Gradually improved with hydration.  Was on lisinopril.  It was held. -Creatinine back to normal now. Recent Labs    04/10/21 2227 04/10/21 2230 04/11/21 0420 04/11/21 0659 04/11/21 1402 04/12/21 0531 04/13/21 0053 04/13/21 0650 04/14/21 0724 04/15/21 0156  BUN 21 22 23  25* 23 30* 27* 26* 25* 22  CREATININE 1.76* 1.80* 2.04* 2.19* 1.91* 1.82* 1.42* 1.32* 1.17 1.07  CO2 17*  --  20* 20* 30 23 25 26 23 26    History of hypertension -Prior to admission, patient was on lisinopril 10 mg daily.  Per wife, he was supposed to be taking 20 mg but he was taking low-dose.  Initially lisinopril was held because of low blood pressure.  Blood pressure now has improved.  AKI improved.  Lisinopril has been resumed at 10 mg.  Post discharge, I would keep him on a higher dose at 20 mg daily.  Type 2 diabetes mellitus -A1c 6.4 on 04/11/2021.  Last A1c before that was 6.7 in July 2022. -Diet controlled Recent Labs  Lab 04/14/21 1518 04/14/21 1959 04/15/21 0417 04/15/21 0602 04/15/21 1147  GLUCAP 111* 161* 139* 113* 117*   Dyslipidemia -HDL low at 23, LDL at 95.  Triglyceride elevated 176.  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  Patient will be on anticoagulation going forward for VTE.  Mobility: Encourage ambulation Goals of care   Code Status: Full Code   Nutritional status:  Body mass index is 29.17 kg/m.      Wounds:  -   Discharge Exam:   Vitals:   04/14/21 2336 04/15/21 0327 04/15/21 0749 04/15/21 1130  BP: 139/78 (!) 160/89 (!) 158/86 (!) 152/83  Pulse: 80 80 80 84  Resp: 16 16 16 16   Temp: 98.7 F (37.1 C) 98.2 F (36.8 C) 98.2 F (36.8 C) 98.1 F (36.7 C)  TempSrc: Oral Oral Oral Oral  SpO2: 97% 94% 94% 97%  Weight:      Height:        Body mass index is 29.17 kg/m.  General  exam: Pleasant, elderly Caucasian male.  Not in physical distress Skin: No rashes, lesions or ulcers. HEENT: Atraumatic, normocephalic, no obvious bleeding Lungs: Clear to auscultation bilaterally CVS: Regular rate and rhythm, no murmur GI/Abd soft, nontender, nondistended, bowel sound present CNS: Alert, awake, oriented x3 Psychiatry: Mood appropriate Extremities: No pedal edema, no calf tenderness  Follow ups:    Follow-up Information     [name], MD Follow up.   Specialty: Family Medicine Contact information: [address] [place] [address] [phone]         Hunsucker, Bonna Gains, MD Follow up.   Specialty: Pulmonary Disease Contact information: [address] [place] [address] [phone]                 Discharge Instructions:   Discharge Instructions     Call MD for:  difficulty breathing, headache or visual disturbances   Complete by: As directed    Call MD for:  extreme fatigue   Complete by: As directed    Call MD for:  hives   Complete by: As directed    Call MD for:  persistant dizziness or light-headedness   Complete by: As directed    Call MD for:  persistant nausea and vomiting   Complete by: As directed    Call MD for:  severe uncontrolled pain   Complete by: As directed    Call MD for:  temperature >100.4   Complete by: As directed    Diet general   Complete by: As directed    Discharge instructions   Complete by: As directed    Recommendations at discharge:   Recommend compliance to medications including blood thinner.  Monitor for any signs and symptoms of bleeding including blood in the vomitus, stool, dark stool, easy bruisability, worsening fatigue  Follow-up with pulmonologist as an outpatient  Follow-up with neurologist as an outpatient.  Home oxygen arranged.  To use as needed.  Discharge instructions for diabetes mellitus: Check blood sugar 3 times a day and bedtime at  home. If blood sugar running above 200 or less than 70 please call your MD to adjust insulin. If you notice signs and symptoms of hypoglycemia (low blood sugar) like jitteriness, confusion, thirst, tremor and sweating, please check blood sugar, drink sugary drink/biscuits/sweets to increase sugar level and call MD or return to ER.    Seizure precautions: -Per [facility] statutes, patients with seizures are not allowed to drive until they have been seizure-free for six months.  -Use caution when using heavy equipment or power tools.  -Avoid working on ladders or at heights.  -Take showers  instead of baths. Ensure the water temperature is not too high on the home water heater.  -Do not go swimming alone.  -Do not lock yourself in a room alone (i.e. bathroom). -When caring for infants or small children, sit down when holding, feeding, or changing them to minimize risk of injury to the child in the event you have a seizure.  -Maintain good sleep hygiene. -Avoid alcohol.    If patient has another seizure, call 911 and bring them back to the ED if: A.  The seizure lasts longer than 5 minutes.      B.  The patient doesn't wake shortly after the seizure or has new problems such as difficulty seeing, speaking or moving following the seizure C.  The patient was injured during the seizure D.  The patient has a temperature over 102 F (39C) E.  The patient vomited during the seizure and now is having trouble breathing    General discharge instructions: Follow with Primary MD [name], MD in 7 days  Please request your PCP  to go over your hospital tests, procedures, radiology results at the follow up. Please get your medicines reviewed and adjusted.  Your PCP may decide to repeat certain labs or tests as needed. Do not drive, operate heavy machinery, perform activities at heights, swimming or participation in water activities or provide baby sitting services if your were admitted  for syncope or siezures until you have seen by Primary MD or a Neurologist and advised to do so again. [place] Controlled Substance Reporting System database was reviewed. Do not drive, operate heavy machinery, perform activities at heights, swim, participate in water activities or provide baby-sitting services while on medications for pain, sleep and mood until your outpatient physician has reevaluated you and advised to do so again.  You are strongly recommended to comply with the dose, frequency and duration of prescribed medications. Activity: As tolerated with Full fall precautions use walker/cane & assistance as needed Avoid using any recreational substances like cigarette, tobacco, alcohol, or non-prescribed drug. If you experience worsening of your admission symptoms, develop shortness of breath, life threatening emergency, suicidal or homicidal thoughts you must seek medical attention immediately by calling 911 or calling your MD immediately  if symptoms less severe. You must read complete instructions/literature along with all the possible adverse reactions/side effects for all the medicines you take and that have been prescribed to you. Take any new medicine only after you have completely understood and accepted all the possible adverse reactions/side effects.  Wear Seat belts while driving. You were cared for by a hospitalist during your hospital stay. If you have any questions about your discharge medications or the care you received while you were in the hospital after you are discharged, you can call the unit and ask to speak with the hospitalist or the covering physician. Once you are discharged, your primary care physician will handle any further medical issues. Please note that NO REFILLS for any discharge medications will be authorized once you are discharged, as it is imperative that you return to your primary care physician (or establish a relationship with a primary care physician  if you do not have one).   Increase activity slowly   Complete by: As directed        Discharge Medications:   Allergies as of 04/15/2021       Reactions   Tape Other (See Comments)   Adhesive tape caused blisters  Medication List     STOP taking these medications    esomeprazole 20 MG capsule Commonly known as: NEXIUM   ibuprofen 200 MG tablet Commonly known as: ADVIL       TAKE these medications    apixaban 5 MG Tabs tablet Commonly known as: ELIQUIS 10 mg twice daily for 7 days followed by 5 mg twice daily for 3 to 6 months   guaiFENesin 600 MG 12 hr tablet Commonly known as: Mucinex Take 1 tablet (600 mg total) by mouth 2 (two) times daily for 7 days.   levETIRAcetam 500 MG tablet Commonly known as: KEPPRA Take 1 tablet (500 mg total) by mouth 2 (two) times daily.   lisinopril 10 MG tablet Commonly known as: ZESTRIL Take 2 tablets (20 mg total) by mouth every morning. What changed: how much to take   pantoprazole 40 MG tablet Commonly known as: Protonix Take 1 tablet (40 mg total) by mouth daily.   VITAMIN D3 PO Take 1 tablet by mouth every morning.               Durable Medical Equipment  (From admission, onward)           Start     Ordered   04/15/21 1333  For home use only DME oxygen  Once       Question Answer Comment  Length of Need 6 Months   Mode or (Route) Nasal cannula   Liters per Minute 2   Frequency Continuous (stationary and portable oxygen unit needed)   Oxygen conserving device Yes   Oxygen delivery system Gas      04/15/21 1332             The results of significant diagnostics from this hospitalization (including imaging, microbiology, ancillary and laboratory) are listed below for reference.    Procedures and Diagnostic Studies:   CT ANGIO HEAD NECK W WO CM  Result Date: 04/11/2021 CLINICAL DATA:  Found unresponsive EXAM: CT ANGIOGRAPHY HEAD AND NECK TECHNIQUE: Multidetector CT imaging of the  head and neck was performed using the standard protocol during bolus administration of intravenous contrast. Multiplanar CT image reconstructions and MIPs were obtained to evaluate the vascular anatomy. Carotid stenosis measurements (when applicable) are obtained utilizing NASCET criteria, using the distal internal carotid diameter as the denominator. RADIATION DOSE REDUCTION: This exam was performed according to the departmental dose-optimization program which includes automated exposure control, adjustment of the mA and/or kV according to patient size and/or use of iterative reconstruction technique. CONTRAST:  134mL OMNIPAQUE IOHEXOL 350 MG/ML SOLN COMPARISON:  No prior CTA, correlation is made with 04/10/2021 CT head FINDINGS: CT HEAD FINDINGS For noncontrast findings, please see same day CT head. CTA NECK FINDINGS Aortic arch: Standard branching. Imaged portion shows no evidence of aneurysm or dissection. No significant stenosis of the major arch vessel origins. Right carotid system: No evidence of dissection, stenosis (50% or greater) or occlusion. Left carotid system: No evidence of dissection, stenosis (50% or greater) or occlusion. Vertebral arteries: No evidence of dissection, stenosis (50% or greater) or occlusion. 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 Evaluation of the right V3 is limited by beam hardening artifact but it is favored to be patent. The left vertebral artery is patent from its origin to the skull base. Skeleton: No acute osseous abnormality. Other neck: Endotracheal and orogastric tubes noted. Upper chest: Please see same-day CT  chest. Review of the MIP images confirms the above findings CTA HEAD FINDINGS Anterior circulation: Both internal carotid arteries are patent to the termini, without significant stenosis. A1 segments patent, with a relatively diminutive left A1. Normal anterior  communicating artery. Anterior cerebral arteries are patent to their distal aspects. No M1 stenosis or occlusion. Normal MCA bifurcations. Distal MCA branches perfused and symmetric. Posterior circulation: The right vertebral artery appears to terminate in PICA, although evaluation is somewhat limited by diminutive size and beam hardening artifact. The left vertebral artery is patent to the vertebrobasilar junction. Posterior inferior cerebral arteries patent bilaterally. Basilar patent to its distal aspect. Superior cerebellar arteries patent bilaterally. Patent P1 segments. PCAs perfused to their distal aspects without stenosis. The bilateral posterior communicating arteries are not visualized. Venous sinuses: As permitted by contrast timing, patent. Anatomic variants: None significant Review of the MIP images confirms the above findings IMPRESSION: 1.  No intracranial large vessel occlusion or significant stenosis. 2. No hemodynamically significant stenosis in the neck. The right vertebral artery appears congenitally diminutive, given the size of the vertebral artery foramen. 3. For findings in the lung apices, please see same-day CT chest. Electronically Signed   By: Merilyn Baba M.D.   On: 04/11/2021 01:06   CT HEAD WO CONTRAST (5MM)  Result Date: 04/10/2021 CLINICAL DATA:  Patient found unresponsive. EXAM: CT HEAD WITHOUT CONTRAST TECHNIQUE: Contiguous axial images were obtained from the base of the skull through the vertex without intravenous contrast. RADIATION DOSE REDUCTION: This exam was performed according to the departmental dose-optimization program which includes automated exposure control, adjustment of the mA and/or kV according to patient size and/or use of iterative reconstruction technique. COMPARISON:  None. FINDINGS: Brain: There is mild cerebral atrophy with widening of the extra-axial spaces and ventricular dilatation. There are areas of decreased attenuation within the white matter  tracts of the supratentorial brain, consistent with microvascular disease changes. 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 Vascular: No hyperdense vessel or unexpected calcification. Skull: Normal. Negative for fracture or focal lesion. Sinuses/Orbits: There is moderate severity posterior left maxillary sinus, bilateral ethmoid sinus and bilateral nasal mucosal thickening. Mild sphenoid sinus and posterior right maxillary sinus mucosal thickening is also seen. Other: Endotracheal and enteric tubes are seen. IMPRESSION: 1. No acute intracranial abnormality. 2. Mild cerebral atrophy with widening of the extra-axial spaces and ventricular dilatation. 3. 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 4. Moderate severity paranasal sinus disease. Electronically Signed   By: Virgina Norfolk M.D.   On: 04/10/2021 23:38   CT Cervical Spine Wo Contrast  Result Date: 04/10/2021 CLINICAL DATA:  Found unresponsive. EXAM: CT CERVICAL SPINE WITHOUT CONTRAST TECHNIQUE: Multidetector CT imaging of the cervical spine was performed without intravenous contrast. Multiplanar CT image reconstructions were also generated. RADIATION DOSE REDUCTION: This exam was performed according to the departmental dose-optimization program which includes automated exposure control, adjustment of the mA and/or kV according to patient size and/or use of iterative reconstruction technique. COMPARISON:  None. FINDINGS: Alignment: Normal. Skull base and vertebrae: No acute fracture. Chronic and degenerative changes seen involving the body and tip of the dens. No primary bone lesion or focal pathologic process. Soft tissues and spinal canal: No prevertebral fluid or swelling. No visible canal hematoma. Disc levels: Marked severity endplate sclerosis and mild associated osteophyte formation are seen at the levels of C5-C6 and C6-C7. There is marked severity narrowing of the anterior atlantoaxial articulation.  Moderate to marked severity intervertebral disc space  narrowing is seen at C5-C6 and C6-C7. Bilateral moderate to marked severity multilevel facet joint hypertrophy is noted. Upper chest: Mild atelectatic changes are seen along the posterior aspect of the right apex. Other: Endotracheal and enteric tubes are in place. IMPRESSION: 1. No acute fracture or subluxation of the cervical spine. 2. Marked severity degenerative changes at the levels of C5-C6 and C6-C7. Electronically Signed   By: Virgina Norfolk M.D.   On: 04/10/2021 23:41   CT T-SPINE NO CHARGE  Result Date: 04/10/2021 CLINICAL DATA:  Unresponsive abnormal posture EXAM: CT Thoracic and Lumbar spine without contrast TECHNIQUE: Multiplanar CT images of the thoracic and lumbar spine were reconstructed from contemporary CT of the Chest, Abdomen, and Pelvis. RADIATION DOSE REDUCTION: This exam was performed according to the departmental dose-optimization program which includes automated exposure control, adjustment of the mA and/or kV according to patient size and/or use of iterative reconstruction technique. CONTRAST:  None or No additional COMPARISON:  04/10/2021 FINDINGS: CT THORACIC SPINE FINDINGS Alignment: Normal. Vertebrae: No acute fracture or focal pathologic process. Probable small hemangioma at T7. Paraspinal and other soft tissues: See separately dictated CT angiography report. Disc levels: Disc spaces are patent. CT LUMBAR SPINE FINDINGS Segmentation: 5 lumbar type vertebrae. Alignment: Normal. Vertebrae: No acute fracture or focal pathologic process. Paraspinal and other soft tissues: Dictated separately. Disc levels: Disc spaces are patent. No high-grade canal stenosis. Mild disc bulge at L4-L5. Hypertrophic facet degenerative changes at L4-L5 and L5-S1. IMPRESSION: 1. No CT evidence for acute osseous abnormality of the thoracic or lumbar spine 2. 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 Mild degenerative changes at L4-L5 and L5-S1 Electronically Signed    By: Donavan Foil M.D.   On: 04/10/2021 23:57   CT L-SPINE NO CHARGE  Result Date: 04/10/2021 CLINICAL DATA:  Unresponsive abnormal posture EXAM: CT Thoracic and Lumbar spine without contrast TECHNIQUE: Multiplanar CT images of the thoracic and lumbar spine were reconstructed from contemporary CT of the Chest, Abdomen, and Pelvis. RADIATION DOSE REDUCTION: This exam was performed according to the departmental dose-optimization program which includes automated exposure control, adjustment of the mA and/or kV according to patient size and/or use of iterative reconstruction technique. CONTRAST:  None or No additional COMPARISON:  04/10/2021 FINDINGS: CT THORACIC SPINE FINDINGS Alignment: Normal. Vertebrae: No acute fracture or focal pathologic process. Probable small hemangioma at T7. Paraspinal and other soft tissues: See separately dictated CT angiography report. Disc levels: Disc spaces are patent. CT LUMBAR SPINE FINDINGS Segmentation: 5 lumbar type vertebrae. Alignment: Normal. Vertebrae: No acute fracture or focal pathologic process. Paraspinal and other soft tissues: Dictated separately. Disc levels: Disc spaces are patent. No high-grade canal stenosis. Mild disc bulge at L4-L5. Hypertrophic facet degenerative changes at L4-L5 and L5-S1. IMPRESSION: 1. No CT evidence for acute osseous abnormality of the thoracic or lumbar spine 2. 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 Mild degenerative changes at L4-L5 and L5-S1 Electronically Signed   By: Donavan Foil M.D.   On: 04/10/2021 23:57   DG Chest Portable 1 View  Result Date: 04/10/2021 CLINICAL DATA:  Intubated EXAM: PORTABLE CHEST 1 VIEW COMPARISON:  None. FINDINGS: Endotracheal tube tip is about 2.6 cm superior to carina. Esophageal tube tip below the diaphragm but incompletely visualized. Mildly low lung volumes. Left lung is grossly clear. Irregular right hilar opacity. No pleural effusion or pneumothorax IMPRESSION: 1. Endotracheal tube tip about 2.6 cm superior to  carina. 2. Irregular right hilar opacity could be due to low lung volume and vascular crowding however  lung mass is also considered. Recommend further evaluation with chest CT. Electronically Signed   By: Donavan Foil M.D.   On: 04/10/2021 22:37   EEG adult  Result Date: 04/11/2021 Lora Havens, MD     04/11/2021  8:42 AM Patient Name: Jaimin Krupka MRN: [id number] Epilepsy Attending: Lora Havens Referring Physician/Provider: Amie Portland, MD Date: 04/10/2021 Duration: 22.25 mins Patient history: 76 year old male found unresponsive.  EMS was called and reported 2 seizures for which she received Versed.  EEG evaluate for seizure Level of alertness: lethargic AEDs during EEG study: LEV Technical aspects: This EEG study was done with scalp electrodes positioned according to the 10-20 International system of electrode placement. Electrical activity was acquired at a sampling rate of 500Hz  and reviewed with a high frequency filter of 70Hz  and a low frequency filter of 1Hz . EEG data were recorded continuously and digitally stored. Description: EEG showed continuous generalized low amplitude mixed frequencies with predominantly 6 to 9 Hz theta-alpha activity admixed with intermittent generalized 2 to 3 Hz delta slowing.  Hyperventilation and photic stimulation were not performed.   ABNORMALITY - Continuous slow, generalized IMPRESSION: This study is suggestive of moderate diffuse encephalopathy, nonspecific etiology. No seizures or epileptiform discharges were seen throughout the recording. Lora Havens   CT Angio Chest/Abd/Pel for Dissection W and/or W/WO  Result Date: 04/10/2021 CLINICAL DATA:  Polytrauma, blunt FALL, FOUND DOWN EXAM: CT ANGIOGRAPHY CHEST, ABDOMEN AND PELVIS TECHNIQUE: Non-contrast CT of the chest was initially obtained. Multidetector CT imaging through the chest, abdomen and pelvis was performed using the standard protocol during bolus administration of intravenous contrast.  Multiplanar reconstructed images and MIPs were obtained and reviewed to evaluate the vascular anatomy. RADIATION DOSE REDUCTION: This exam was performed according to the departmental dose-optimization program which includes automated exposure control, adjustment of the mA and/or kV according to patient size and/or use of iterative reconstruction technique. CONTRAST:  149mL OMNIPAQUE IOHEXOL 350 MG/ML SOLN COMPARISON:  Radiograph earlier today FINDINGS: CTA CHEST FINDINGS Cardiovascular: No aortic dissection, aneurysm, acute aortic syndrome, or aortic hematoma noncontrast exam. Mild atherosclerosis. Conventional branching pattern from the aortic arch. Exam not tailored for pulmonary artery evaluation, no obvious central pulmonary embolus. Coronary artery calcifications. Heart is normal in size no pericardial effusion. Mediastinum/Nodes: Endotracheal tube tip above the carina. Enteric tube is in place, despite decompression there is fluid in the esophagus. Scattered small mediastinal lymph nodes not enlarged by size criteria. No suspicious thyroid nodule. Lungs/Pleura: There is complete filling of the right bronchus intermedius with segmental occlusion of the right middle and lower lobe bronchi. Right upper lobe and left-sided bronchial thickening with areas of mucous plugging. Multifocal tree-in-bud ground-glass nodularity within both lungs, perihilar and dependent predominant. No pneumothorax or pleural effusion. Incidental azygous fissure. Musculoskeletal: No acute fractures. Incidental T7 vertebral body hemangioma. No chest wall soft tissue abnormalities. Review of the MIP images confirms the above findings. CTA ABDOMEN AND PELVIS FINDINGS VASCULAR Aorta: Moderate atherosclerosis. Normal caliber aorta without aneurysm, dissection, vasculitis or significant stenosis. Celiac: Patent without evidence of aneurysm, dissection, vasculitis or significant stenosis. SMA: Patent without evidence of aneurysm, dissection,  vasculitis or significant stenosis. Renals: Both renal arteries are patent without evidence of aneurysm, dissection, vasculitis, fibromuscular dysplasia or significant stenosis. IMA: Patent without evidence of aneurysm, dissection, vasculitis or significant stenosis. Inflow: Patent without evidence of aneurysm, dissection, vasculitis or significant stenosis. Veins: No obvious venous abnormality within the limitations of this arterial phase study. There is a circumaortic left renal vein.  Review of the MIP images confirms the above findings. NON-VASCULAR Hepatobiliary: No focal hepatic abnormality on this arterial phase exam. Multiple gallstones. No gallbladder inflammation or biliary dilatation. Pancreas: No ductal dilatation or inflammation. Spleen: Normal in size and arterial enhancement. Adrenals/Urinary Tract: No adrenal nodule. No hydronephrosis. Homogeneous renal enhancement. No visualized renal calculi. Only minimally distended urinary bladder. Stomach/Bowel: Tip of the enteric tube within the stomach. There is fluid distending the distal esophagus. Fluid within the stomach. Small bowel are fluid-filled and mildly prominent, no discrete transition point to suggest obstruction. There is no small bowel pneumatosis or obvious small bowel inflammation. Fluid/liquid stool in the right, at descending and sigmoid colon. Suspected appendectomy with surgical sutures at the base of the cecum. Scattered colonic diverticulosis. There is no diverticulitis or colonic wall thickening. No colonic pneumatosis. Lymphatic: No abdominopelvic adenopathy. Calcification in the right upper abdomen posterior to the right lobe of the liver may represent calcified lymph known in sequela of prior granulomatous disease. Reproductive: Enlarged prostate gland spans 5.6 cm transverse. Other: No free air or abdominopelvic ascites. There is minimal fat in the inguinal canals. Musculoskeletal: No acute fracture. No abdominal wall soft tissue  abnormalities. Review of the MIP images confirms the above findings. IMPRESSION: 1. No aortic dissection or acute aortic findings. Mild atherosclerosis. 2. No evidence of acute traumatic injury to the chest, abdomen, or pelvis. 3. Complete bronchial filling of bronchus intermedius. Additional diffuse bronchial thickening and areas mucous plugging. Multifocal tree-in-bud ground-glass nodularity within both lungs, perihilar and dependent predominant. Fluid distending the distal esophagus. Constellation of findings suspicious for aspiration. 4. Fluid-filled and mildly prominent small bowel, no discrete transition point to suggest obstruction. Findings may represent enteritis, ileus, or slow transit. There is also liquid stool in the colon suggesting diarrheal process. 5. Colonic diverticulosis without acute diverticulitis. 6. Cholelithiasis without acute cholecystitis. 7. Enlarged prostate gland. Aortic Atherosclerosis (ICD10-I70.0). Electronically Signed   By: Keith Rake M.D.   On: 04/10/2021 23:52     Labs:   Basic Metabolic Panel: Recent Labs  Lab 04/11/21 0420 04/11/21 4627 04/11/21 0920 04/11/21 1402 04/12/21 0531 04/13/21 0053 04/13/21 0650 04/14/21 0724 04/15/21 0156  NA 138 142   < > 140 142 140 140 139 139  K 4.5 4.0   < > 4.3 4.5 3.9 3.9 3.6 3.0*  CL 108 110  --  101 110 105 106 104 101  CO2 20* 20*  --  30 23 25 26 23 26   GLUCOSE 134* 102*  --  95 123* 86 95 122* 134*  BUN 23 25*  --  23 30* 27* 26* 25* 22  CREATININE 2.04* 2.19*  --  1.91* 1.82* 1.42* 1.32* 1.17 1.07  CALCIUM 7.8* 7.6*  --  6.3* 8.1* 8.3* 8.4* 8.8* 8.7*  MG 1.6* 1.5*  --  1.6*  --  2.2  --  1.8 1.5*  PHOS 2.7  --   --   --   --   --   --   --  4.0   < > = values in this interval not displayed.   GFR Estimated Creatinine Clearance: 65.1 mL/min (by C-G formula based on SCr of 1.07 mg/dL). Liver Function Tests: Recent Labs  Lab 04/10/21 2227  AST 22  ALT 18  ALKPHOS 65  BILITOT 0.6  PROT 6.4*   ALBUMIN 3.8   No results for input(s): LIPASE, AMYLASE in the last 168 hours. Recent Labs  Lab 04/10/21 2227  AMMONIA 26   Coagulation profile  Recent Labs  Lab 04/10/21 2227  INR 1.1    CBC: Recent Labs  Lab 04/10/21 2227 04/10/21 2230 04/11/21 1402 04/12/21 0531 04/13/21 0650 04/14/21 0724 04/15/21 0156  WBC 17.0*   < > 4.3 9.9 5.7 7.3 5.9  NEUTROABS 14.5*  --  3.8  --   --   --  4.3  HGB 15.5   < > 11.3* 11.8* 9.8* 10.5* 11.5*  HCT 49.2   < > 34.7* 36.2* 30.5* 32.2* 33.7*  MCV 86.3   < > 83.6 84.4 84.3 83.9 81.6  PLT 292   < > 180 170 123* 130* 124*   < > = values in this interval not displayed.   Cardiac Enzymes: No results for input(s): CKTOTAL, CKMB, CKMBINDEX, TROPONINI in the last 168 hours. BNP: Invalid input(s): POCBNP CBG: Recent Labs  Lab 04/14/21 1518 04/14/21 1959 04/15/21 0417 04/15/21 0602 04/15/21 1147  GLUCAP 111* 161* 139* 113* 117*   D-Dimer No results for input(s): DDIMER in the last 72 hours. Hgb A1c No results for input(s): HGBA1C in the last 72 hours. Lipid Profile Recent Labs    04/15/21 0156  CHOL 153  HDL 23*  LDLCALC 95  TRIG 176*  CHOLHDL 6.7   Thyroid function studies No results for input(s): TSH, T4TOTAL, T3FREE, THYROIDAB in the last 72 hours.  Invalid input(s): FREET3 Anemia work up No results for input(s): VITAMINB12, FOLATE, FERRITIN, TIBC, IRON, RETICCTPCT in the last 72 hours. Microbiology Recent Results (from the past 240 hour(s))  Resp Panel by RT-PCR (Flu A&B, Covid) Nasopharyngeal Swab     Status: None   Collection Time: 04/10/21 10:30 PM   Specimen: Nasopharyngeal Swab; Nasopharyngeal(NP) swabs in vial transport medium  Result Value Ref Range Status   SARS Coronavirus 2 by RT PCR NEGATIVE NEGATIVE Final    Comment: (NOTE) SARS-CoV-2 target nucleic acids are NOT DETECTED.  The SARS-CoV-2 RNA is generally detectable in upper respiratory specimens during the acute phase of infection. The  lowest concentration of SARS-CoV-2 viral copies this assay can detect is 138 copies/mL. A negative result does not preclude SARS-Cov-2 infection and should not be used as the sole basis for treatment or other patient management decisions. A negative result may occur with  improper specimen collection/handling, submission of specimen other than nasopharyngeal swab, presence of viral mutation(s) within the areas targeted by this assay, and inadequate number of viral copies(<138 copies/mL). A negative result must be combined with clinical observations, patient history, and epidemiological information. The expected result is Negative.  Fact Sheet for Patients:  EntrepreneurPulse.com.au  Fact Sheet for Healthcare Providers:  IncredibleEmployment.be  This test is no t yet approved or cleared by the [place] FDA and  has been authorized for detection and/or diagnosis of SARS-CoV-2 by FDA under an Emergency Use Authorization (EUA). This EUA will remain  in effect (meaning this test can be used) for the duration of the COVID-19 declaration under Section 564(b)(1) of the Act, 21 U.S.C.section 360bbb-3(b)(1), unless the authorization is terminated  or revoked sooner.       Influenza A by PCR NEGATIVE NEGATIVE Final   Influenza B by PCR NEGATIVE NEGATIVE Final    Comment: (NOTE) The Xpert Xpress SARS-CoV-2/FLU/RSV plus assay is intended as an aid in the diagnosis of influenza from Nasopharyngeal swab specimens and should not be used as a sole basis for treatment. Nasal washings and aspirates are unacceptable for Xpert Xpress SARS-CoV-2/FLU/RSV testing.  Fact Sheet for Patients: EntrepreneurPulse.com.au  Fact Sheet for Healthcare Providers:  IncredibleEmployment.be  This test is not yet approved or cleared by the [place] and has been authorized for detection and/or diagnosis of SARS-CoV-2 by FDA under  an Emergency Use Authorization (EUA). This EUA will remain in effect (meaning this test can be used) for the duration of the COVID-19 declaration under Section 564(b)(1) of the Act, 21 U.S.C. section 360bbb-3(b)(1), unless the authorization is terminated or revoked.  Performed at [facility], [place] [address] [place], [place] [address]   MRSA Next Gen by PCR, Nasal     Status: None   Collection Time: 04/11/21  1:35 AM   Specimen: Nasal Mucosa; Nasal Swab  Result Value Ref Range Status   MRSA by PCR Next Gen NOT DETECTED NOT DETECTED Final    Comment: (NOTE) The GeneXpert MRSA Assay (FDA approved for NASAL specimens only), is one component of a comprehensive MRSA colonization surveillance program. It is not intended to diagnose MRSA infection nor to guide or monitor treatment for MRSA infections. Test performance is not FDA approved in patients less than 80 years old. Performed at [facility], [place] [address] [place], [place] [address]   Culture, blood (Routine X 2) w Reflex to ID Panel     Status: None (Preliminary result)   Collection Time: 04/11/21  3:48 PM   Specimen: BLOOD LEFT HAND  Result Value Ref Range Status   Specimen Description BLOOD LEFT HAND  Final   Special Requests   Final    BOTTLES DRAWN AEROBIC AND ANAEROBIC Blood Culture adequate volume   Culture   Final    NO GROWTH 4 DAYS Performed at [facility], [place] [address], [place], [place] [address]    Report Status PENDING  Incomplete  Culture, blood (Routine X 2) w Reflex to ID Panel     Status: None (Preliminary result)   Collection Time: 04/11/21  4:00 PM   Specimen: BLOOD  Result Value Ref Range Status   Specimen Description BLOOD SITE NOT SPECIFIED  Final   Special Requests   Final    BOTTLES DRAWN AEROBIC AND ANAEROBIC Blood Culture adequate volume   Culture   Final    NO GROWTH 4 DAYS Performed at [facility], [place] [address], [place],  [address]    Report Status  PENDING  Incomplete  Gastrointestinal Panel by PCR , Stool     Status: Abnormal   Collection Time: 04/13/21  5:14 AM   Specimen: STOOL  Result Value Ref Range Status   Campylobacter species NOT DETECTED NOT DETECTED Final   Plesimonas shigelloides NOT DETECTED NOT DETECTED Final   Salmonella species NOT DETECTED NOT DETECTED Final   Yersinia enterocolitica NOT DETECTED NOT DETECTED Final   Vibrio species NOT DETECTED NOT DETECTED Final   Vibrio cholerae NOT DETECTED NOT DETECTED Final   Enteroaggregative E coli (EAEC) NOT DETECTED NOT DETECTED Final   Enteropathogenic E coli (EPEC) NOT DETECTED NOT DETECTED Final   Enterotoxigenic E coli (ETEC) NOT DETECTED NOT DETECTED Final   Shiga like toxin producing E coli (STEC) NOT DETECTED NOT DETECTED Final   Shigella/Enteroinvasive E coli (EIEC) NOT DETECTED NOT DETECTED Final   Cryptosporidium NOT DETECTED NOT DETECTED Final   Cyclospora cayetanensis NOT DETECTED NOT DETECTED Final   Entamoeba histolytica NOT DETECTED NOT DETECTED Final   Giardia lamblia NOT DETECTED NOT DETECTED Final   Adenovirus F40/41 NOT DETECTED NOT DETECTED Final   Astrovirus NOT DETECTED NOT DETECTED Final   Norovirus  GI/GII DETECTED (A) NOT DETECTED Final    Comment: CRITICAL RESULT CALLED TO, READ BACK BY AND VERIFIED WITH: JENNIFER CARMICHAEL 04/14/21 1230 MW    Rotavirus A NOT DETECTED NOT DETECTED Final   Sapovirus (I, II, IV, and V) NOT DETECTED NOT DETECTED Final    Comment: Performed at [facility], [place], [place] [address]  C Difficile Quick Screen w PCR reflex     Status: None   Collection Time: 04/13/21  5:19 AM   Specimen: STOOL  Result Value Ref Range Status   C Diff antigen NEGATIVE NEGATIVE Final   C Diff toxin NEGATIVE NEGATIVE Final   C Diff interpretation No C. difficile detected.  Final    Comment: Performed at [facility], [place] [address], [place], [place] [address]    Time coordinating discharge: 35  minutes  Signed: Danyon Mcginness  [facility] 04/15/2021, 3:10 PM

## 2021-04-15 NOTE — Discharge Instructions (Addendum)
Information on my medicine - ELIQUIS (apixaban)  This medication education was reviewed with me or my healthcare representative as part of my discharge preparation.  Why was Eliquis prescribed for you? Eliquis was prescribed to treat blood clots that may have been found in the veins of your legs (deep vein thrombosis) or in your lungs (pulmonary embolism) and to reduce the risk of them occurring again.  What do You need to know about Eliquis ? The starting dose is 10 mg (two 5 mg tablets) taken TWICE daily for the FIRST SEVEN (7) DAYS, then on 04/23/21 in the AM the dose is reduced to ONE 5 mg tablet taken TWICE daily.  Eliquis may be taken with or without food.   Try to take the dose about the same time in the morning and in the evening. If you have difficulty swallowing the tablet whole please discuss with your pharmacist how to take the medication safely.  Take Eliquis exactly as prescribed and DO NOT stop taking Eliquis without talking to the doctor who prescribed the medication.  Stopping may increase your risk of developing a new blood clot.  Refill your prescription before you run out.  After discharge, you should have regular check-up appointments with your healthcare provider that is prescribing your Eliquis.    What do you do if you miss a dose? If a dose of ELIQUIS is not taken at the scheduled time, take it as soon as possible on the same day and twice-daily administration should be resumed. The dose should not be doubled to make up for a missed dose.  Important Safety Information A possible side effect of Eliquis is bleeding. You should call your healthcare provider right away if you experience any of the following: Bleeding from an injury or your nose that does not stop. Unusual colored urine (red or dark brown) or unusual colored stools (red or black). Unusual bruising for unknown reasons. A serious fall or if you hit your head (even if there is no bleeding).  Some  medicines may interact with Eliquis and might increase your risk of bleeding or clotting while on Eliquis. To help avoid this, consult your healthcare provider or pharmacist prior to using any new prescription or non-prescription medications, including herbals, vitamins, non-steroidal anti-inflammatory drugs (NSAIDs) and supplements.  This website has more information on Eliquis (apixaban): http://www.eliquis.com/eliquis/home

## 2021-04-15 NOTE — Progress Notes (Signed)
SATURATION QUALIFICATIONS: (This note is used to comply with regulatory documentation for home oxygen)  Patient Saturations on Room Air at Rest = 90%  Patient Saturations on Room Air while Ambulating = 85%  Patient Saturations on 3 Liters of oxygen while Ambulating = 92%  Please briefly explain why patient needs home oxygen:

## 2021-04-15 NOTE — Progress Notes (Signed)
Nsg Discharge Note  Admit Date:  04/10/2021 Discharge date: 04/15/2021   Christopher Francis to be D/C'd Home per MD order.  AVS completed. Patient/caregiver able to verbalize understanding.  Discharge Medication: Allergies as of 04/15/2021       Reactions   Tape Other (See Comments)   Adhesive tape caused blisters        Medication List     STOP taking these medications    esomeprazole 20 MG capsule Commonly known as: NEXIUM   ibuprofen 200 MG tablet Commonly known as: ADVIL       TAKE these medications    apixaban 5 MG Tabs tablet Commonly known as: ELIQUIS 10 mg twice daily for 7 days followed by 5 mg twice daily for 3 to 6 months Notes to patient: Take TWO 5 mg tablets TWICE daily for the FIRST SEVEN (7) DAYS, then on 04/23/21 in the AM the dose is reduced to ONE 5 mg tablet taken TWICE daily   guaiFENesin 600 MG 12 hr tablet Commonly known as: Mucinex Take 1 tablet (600 mg total) by mouth 2 (two) times daily for 7 days.   levETIRAcetam 500 MG tablet Commonly known as: KEPPRA Take 1 tablet (500 mg total) by mouth 2 (two) times daily.   lisinopril 10 MG tablet Commonly known as: ZESTRIL Take 2 tablets (20 mg total) by mouth every morning. What changed: how much to take Notes to patient: You will take TWO (2) 10 mg tablets to equal a dose of 20 mg   pantoprazole 40 MG tablet Commonly known as: Protonix Take 1 tablet (40 mg total) by mouth daily.   VITAMIN D3 PO Take 1 tablet by mouth every morning.               Durable Medical Equipment  (From admission, onward)           Start     Ordered   04/15/21 1333  For home use only DME oxygen  Once       Question Answer Comment  Length of Need 6 Months   Mode or (Route) Nasal cannula   Liters per Minute 2   Frequency Continuous (stationary and portable oxygen unit needed)   Oxygen conserving device Yes   Oxygen delivery system Gas      04/15/21 1332            Discharge Assessment: Vitals:    04/15/21 0749 04/15/21 1130  BP: (!) 158/86 (!) 152/83  Pulse: 80 84  Resp: 16 16  Temp: 98.2 F (36.8 C) 98.1 F (36.7 C)  SpO2: 94% 97%   Skin clean, dry and intact without evidence of skin break down, no evidence of skin tears noted. IV catheter discontinued intact. Site without signs and symptoms of complications - no redness or edema noted at insertion site, patient denies c/o pain - only slight tenderness at site.  Dressing with slight pressure applied.  D/c Instructions-Education: Discharge instructions given to patient/family with verbalized understanding. D/c education completed with patient/family including follow up instructions, medication list, d/c activities limitations if indicated, with other d/c instructions as indicated by MD - patient able to verbalize understanding, all questions fully answered. Patient instructed to return to ED, call 911, or call MD for any changes in condition.  Patient escorted via [place], and D/C home via private auto.  Atilano Ina, RN 04/15/2021 5:38 PM

## 2021-04-15 NOTE — TOC Transition Note (Signed)
Transition of Care [facility]) - CM/SW Discharge Note   Patient Details  Name: Christopher Francis MRN: [id number] Date of Birth: March 22, 1945  [facility] CM/SW Contact:  Carles Collet, RN Phone Number: 04/15/2021, 3:16 PM   Clinical Narrative:    Damaris Schooner w patient's wife to discuss DC planning. No HH needs. DME- oxygen ordered through [employer]. Meds Eliquis card texted to wife, she verbalized understanding on how to use at pharmacy. No other TOC needs identified    Final next level of care: Home/Self Care Barriers to Discharge: No Barriers Identified   Patient Goals and CMS Choice        Discharge Placement                       Discharge Plan and Services                DME Arranged: Oxygen DME Agency: [employer] Date DME Agency Contacted: 04/15/21 Time DME Agency Contacted: [phone] Representative spoke with at DME Agency: [place] ([place]) Interventions     Readmission Risk Interventions No flowsheet data found.

## 2021-04-15 NOTE — Progress Notes (Signed)
I called the patient's preferred [facility] pharmacy ([address] [place], [place] [address] - store [phone]) to verify apixaban coverage.  A prescription for apixaban PO 10 mg BID x 7 days then apixaban PO 5 mg BID (quantity 74 tablets, 0 refills) has been sent to the pharmacy.  The pharmacy confirmed that the prescription was received and is covered by insurance (copay will be $47). I gave the patient a copay card (his wife also has one). I also called the patient's wife, who stated that she is going to pick up the patient's medication before the pharmacy closes at 6 PM tonight.  Shauna Hugh, PharmD, [place]  PGY-2 Pharmacy Resident 04/15/2021 3:48 PM  Please check [email or web address] for unit-specific pharmacy phone numbers.

## 2021-04-16 LAB — CULTURE, BLOOD (ROUTINE X 2)
Culture: NO GROWTH
Culture: NO GROWTH
Special Requests: ADEQUATE
Special Requests: ADEQUATE

## 2021-04-17 NOTE — Telephone Encounter (Signed)
Pt scheduled for an office visit with Dr. Lyndel Safe on 05/30/2021 at 10:00 AM:  Pt made aware: Address Provided Pt verbalized understanding with all questions answered.

## 2021-04-20 NOTE — Telephone Encounter (Signed)
We will discuss more at follow-up visit ?RG ?

## 2021-05-02 ENCOUNTER — Other Ambulatory Visit: Payer: Self-pay

## 2021-05-02 ENCOUNTER — Ambulatory Visit (INDEPENDENT_AMBULATORY_CARE_PROVIDER_SITE_OTHER): Payer: Medicare (Managed Care) | Admitting: Pulmonary Disease

## 2021-05-02 ENCOUNTER — Encounter: Payer: Self-pay | Admitting: Pulmonary Disease

## 2021-05-02 ENCOUNTER — Ambulatory Visit (INDEPENDENT_AMBULATORY_CARE_PROVIDER_SITE_OTHER): Payer: Medicare (Managed Care)

## 2021-05-02 VITALS — BP 128/70 | HR 91 | Temp 98.7°F | Ht 68.0 in | Wt 178.0 lb

## 2021-05-02 DIAGNOSIS — R0609 Other forms of dyspnea: Secondary | ICD-10-CM

## 2021-05-02 DIAGNOSIS — I2609 Other pulmonary embolism with acute cor pulmonale: Secondary | ICD-10-CM

## 2021-05-02 DIAGNOSIS — R052 Subacute cough: Secondary | ICD-10-CM | POA: Diagnosis not present

## 2021-05-02 MED ORDER — APIXABAN 5 MG PO TABS: 5.0000 mg | ORAL_TABLET | Freq: Two times a day (BID) | ORAL | 0 refills | Status: DC

## 2021-05-02 MED ORDER — PANTOPRAZOLE SODIUM 40 MG PO TBEC: 40.0000 mg | DELAYED_RELEASE_TABLET | Freq: Two times a day (BID) | ORAL | 2 refills | Status: DC

## 2021-05-02 NOTE — Progress Notes (Signed)
[name], male    DOB: 1945/08/11, 76 y.o.   MRN: [id number] ? ?Chief Complaint  ?Patient presents with  ? Hospitalization Follow-up  ?  Pt is here for a hospital follow up. Pt states he passed out at home and aspirated and placed on vent. And blood clots noted in legs and in his lungs. Pt is on eliquis daily   ? ? ?Referring provider: ?[name], MD ? ?HPI:  ? ?76 y.o. man whom we are seeing in follow-up after hospitalization and provoked pulmonary embolus.  H&P reviewed, discharge summary reviewed, most recent PCCM note reviewed. ? ?Patient was admitted to the hospital with encephalopathy, likely aspirated due to his encephalopathy was doubly intubated and brought to the ICU.  He was weaned from the ventilator and extubated.  Encephalopathy without clear cause, brain imaging, EEG clear.  He was ambulating around the ICU a few days into stay and noted to be hypoxemic to mid 80s.  This prompted CTA PE protocol that revealed pulmonary emboli.  TTE was obtained which showed normal RV size and function with elevated pulmonary artery pressures, dilated LA, dilated RA.  He continues on apixaban currently dyspnea is improving.  He is get on his feet, getting stamina back. ? ?Chief complaint today is cough.  Present for the last couple weeks.  Only started while in the hospital.  Worse when he lies down at night but also worsening lying down during the day.  He denies any frank heartburn and minimal reflux symptoms per his report.  Occasional nasal congestion but denies frank postnasal drip.  Notably, he has been on lisinopril for a while and recently increased per his report. ? ?Reviewed chest x-ray 04/10/2021 that shows clear lungs bilaterally on my interpretation.  Reviewed chest x-ray 04/14/2020 shows bilateral small pleural effusions, on my interpretation, CTA PE protocol 2/24-23 shows pulmonary embolus and bilateral pleural effusions on my interpretation. ? ?Questionaires / Pulmonary  Flowsheets:  ? ?ACT:  ?No flowsheet data found. ? ?MMRC: ?No flowsheet data found. ? ?Epworth:  ?No flowsheet data found. ? ?Tests:  ? ?FENO:  ?No results found for: NITRICOXIDE ? ?PFT: ?No flowsheet data found. ? ?WALK:  ?No flowsheet data found. ? ?Imaging: ?Personally reviewed and as per EMR and discussion in this note ?CT ANGIO HEAD NECK W WO CM ? ?Result Date: 04/11/2021 ?CLINICAL DATA:  Found unresponsive EXAM: CT ANGIOGRAPHY HEAD AND NECK TECHNIQUE: Multidetector CT imaging of the head and neck was performed using the standard protocol during bolus administration of intravenous contrast. Multiplanar CT image reconstructions and MIPs were obtained to evaluate the vascular anatomy. Carotid stenosis measurements (when applicable) are obtained utilizing NASCET criteria, using the distal internal carotid diameter as the denominator. RADIATION DOSE REDUCTION: This exam was performed according to the departmental dose-optimization program which includes automated exposure control, adjustment of the mA and/or kV according to patient size and/or use of iterative reconstruction technique. CONTRAST:  124m OMNIPAQUE IOHEXOL 350 MG/ML SOLN COMPARISON:  No prior CTA, correlation is made with 04/10/2021 CT head FINDINGS: CT HEAD FINDINGS For noncontrast findings, please see same day CT head. CTA NECK FINDINGS Aortic arch: Standard branching. Imaged portion shows no evidence of aneurysm or dissection. No significant stenosis of the major arch vessel origins. Right carotid system: No evidence of dissection, stenosis (50% or greater) or occlusion. Left carotid system: No evidence of dissection, stenosis (50% or greater) or occlusion. Vertebral arteries: No evidence of dissection, stenosis (50% or greater) or occlusion. 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 Evaluation of the right V3 is limited by beam hardening artifact but it is  favored to be patent. The left vertebral artery is patent from its origin to the skull base. Skeleton: No acute osseous abnormality. Other neck: Endotracheal and orogastric tubes noted. Upper chest: Please see same-day CT chest. Review of the MIP images confirms the above findings CTA HEAD FINDINGS Anterior circulation: Both internal carotid arteries are patent to the termini, without significant stenosis. A1 segments patent, with a relatively diminutive left A1. Normal anterior communicating artery. Anterior cerebral arteries are patent to their distal aspects. No M1 stenosis or occlusion. Normal MCA bifurcations. Distal MCA branches perfused and symmetric. Posterior circulation: The right vertebral artery appears to terminate in PICA, although evaluation is somewhat limited by diminutive size and beam hardening artifact. The left vertebral artery is patent to the vertebrobasilar junction. Posterior inferior cerebral arteries patent bilaterally. Basilar patent to its distal aspect. Superior cerebellar arteries patent bilaterally. Patent P1 segments. PCAs perfused to their distal aspects without stenosis. The bilateral posterior communicating arteries are not visualized. Venous sinuses: As permitted by contrast timing, patent. Anatomic variants: None significant Review of the MIP images confirms the above findings IMPRESSION: 1.  No intracranial large vessel occlusion or significant stenosis. 2. No hemodynamically significant stenosis in the neck. The right vertebral artery appears congenitally diminutive, given the size of the vertebral artery foramen. 3. For findings in the lung apices, please see same-day CT chest. Electronically Signed   By: Merilyn Baba M.D.   On: 04/11/2021 01:06  ? ?CT HEAD WO CONTRAST (5MM) ? ?Result Date: 04/10/2021 ?CLINICAL DATA:  Patient found unresponsive. EXAM: CT HEAD WITHOUT CONTRAST TECHNIQUE: Contiguous axial images were obtained from the base of the skull through the vertex  without intravenous contrast. RADIATION DOSE REDUCTION: This exam was performed according to the departmental dose-optimization program which includes automated exposure control, adjustment of the mA and/or kV according to patient size and/or use of iterative reconstruction technique. COMPARISON:  None. FINDINGS: Brain: There is mild cerebral atrophy with widening of the extra-axial spaces and ventricular dilatation. There are areas of decreased attenuation within the white matter tracts of the supratentorial brain, consistent with microvascular disease changes. 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 Vascular: No hyperdense vessel or unexpected calcification. Skull: Normal. Negative for fracture or focal lesion. Sinuses/Orbits: There is moderate severity posterior left maxillary sinus, bilateral ethmoid sinus and bilateral nasal mucosal thickening. Mild sphenoid sinus and posterior right maxillary sinus mucosal thickening is also seen. Other: Endotracheal and enteric tubes are seen. IMPRESSION: 1. No acute intracranial abnormality. 2. Mild cerebral atrophy with widening of the extra-axial spaces and ventricular dilatation. 3. 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 4. Moderate severity paranasal sinus disease. Electronically Signed   By: Virgina Norfolk M.D.   On: 04/10/2021 23:38  ? ?CT Angio Chest Pulmonary Embolism (PE) W or WO Contrast ? ?Result Date: 04/14/2021 ?CLINICAL DATA:  Chest pain, shortness of breath EXAM: CT ANGIOGRAPHY CHEST WITH CONTRAST TECHNIQUE: Multidetector CT imaging of the chest was performed using the standard protocol during bolus administration of intravenous contrast. Multiplanar CT image reconstructions and MIPs were obtained to evaluate the vascular anatomy. RADIATION DOSE REDUCTION: This exam was performed according to the departmental dose-optimization program which includes automated exposure control, adjustment of the mA and/or kV  according to patient size and/or  use of iterative reconstruction technique. CONTRAST:  98m OMNIPAQUE IOHEXOL 350 MG/ML SOLN COMPARISON:  Previous studies including CT chest done on 04/10/2021 FINDINGS: Cardiovascular:

## 2021-05-02 NOTE — Patient Instructions (Addendum)
Nice to meet you ? ?I will help coordinate with Dr. Burt Knack repeating the heart ultrasound in the coming months ? ?Continue Eliquis.  I refilled this today.  We will need to take this for at least 3 months but we will determine the length after the heart ultrasound. ? ?I think the cough is most like related to reflux given it happens when you lie down or lie more flat regardless of time of day.  I recommend increasing pantoprazole to twice a day.  I have sent a new prescription for this. ? ?The cough could be related to lisinopril.  If the cough is persisting over the next several weeks I would advocate for trying a different blood pressure medication to see if the cough improves. ? ?Lastly, we will get a chest x-ray today. ? ?Return to clinic in 3 months or sooner as needed with Dr. Silas Flood ? ? ?

## 2021-05-04 NOTE — Progress Notes (Signed)
Chest xray improved from time in hospital which is reassuring.

## 2021-05-30 ENCOUNTER — Ambulatory Visit (INDEPENDENT_AMBULATORY_CARE_PROVIDER_SITE_OTHER): Payer: Medicare (Managed Care) | Admitting: Gastroenterology

## 2021-05-30 ENCOUNTER — Other Ambulatory Visit (INDEPENDENT_AMBULATORY_CARE_PROVIDER_SITE_OTHER): Payer: Medicare (Managed Care)

## 2021-05-30 ENCOUNTER — Encounter: Payer: Self-pay | Admitting: Gastroenterology

## 2021-05-30 VITALS — BP 140/86 | HR 85 | Ht 68.0 in | Wt 183.2 lb

## 2021-05-30 DIAGNOSIS — R109 Unspecified abdominal pain: Secondary | ICD-10-CM | POA: Diagnosis not present

## 2021-05-30 LAB — COMPREHENSIVE METABOLIC PANEL
ALT: 12 U/L (ref 0–53)
AST: 16 U/L (ref 0–37)
Albumin: 4.4 g/dL (ref 3.5–5.2)
Alkaline Phosphatase: 63 U/L (ref 39–117)
BUN: 15 mg/dL (ref 6–23)
CO2: 28 mEq/L (ref 19–32)
Calcium: 9.9 mg/dL (ref 8.4–10.5)
Chloride: 103 mEq/L (ref 96–112)
Creatinine, Ser: 1.27 mg/dL (ref 0.40–1.50)
GFR: 55.41 mL/min — ABNORMAL LOW (ref >60.00)
Glucose, Bld: 102 mg/dL — ABNORMAL HIGH (ref 70–99)
Potassium: 4.6 mEq/L (ref 3.5–5.1)
Sodium: 138 mEq/L (ref 135–145)
Total Bilirubin: 0.5 mg/dL (ref 0.2–1.2)
Total Protein: 6.6 g/dL (ref 6.0–8.3)

## 2021-05-30 LAB — CBC WITH DIFFERENTIAL/PLATELET
Basophils Absolute: 0 10*3/uL (ref 0.0–0.1)
Basophils Relative: 0.6 % (ref 0.0–3.0)
Eosinophils Absolute: 0.1 10*3/uL (ref 0.0–0.7)
Eosinophils Relative: 2.1 % (ref 0.0–5.0)
HCT: 41.8 % (ref 39.0–52.0)
Hemoglobin: 13.4 g/dL (ref 13.0–17.0)
Lymphocytes Relative: 25.2 % (ref 12.0–46.0)
Lymphs Abs: 1 10*3/uL (ref 0.7–4.0)
MCHC: 32.1 g/dL (ref 30.0–36.0)
MCV: 82.5 fl (ref 78.0–100.0)
Monocytes Absolute: 0.4 10*3/uL (ref 0.1–1.0)
Monocytes Relative: 9.7 % (ref 3.0–12.0)
Neutro Abs: 2.5 10*3/uL (ref 1.4–7.7)
Neutrophils Relative %: 62.4 % (ref 43.0–77.0)
Platelets: 218 10*3/uL (ref 150.0–400.0)
RBC: 5.07 Mil/uL (ref 4.22–5.81)
RDW: 14.3 % (ref 11.5–15.5)
WBC: 4 10*3/uL (ref 4.0–10.5)

## 2021-05-30 LAB — C-REACTIVE PROTEIN: CRP: <1 mg/dL (ref 0.5–20.0)

## 2021-05-30 LAB — MAGNESIUM: Magnesium: 1.8 mg/dL (ref 1.5–2.5)

## 2021-05-30 LAB — TSH: TSH: 2.98 u[IU]/mL (ref 0.35–5.50)

## 2021-05-30 MED ORDER — DICYCLOMINE HCL 10 MG PO CAPS: 10.0000 mg | ORAL_CAPSULE | Freq: Three times a day (TID) | ORAL | 0 refills | Status: DC

## 2021-05-30 MED ORDER — PANTOPRAZOLE SODIUM 40 MG PO TBEC: 40.0000 mg | DELAYED_RELEASE_TABLET | Freq: Every day | ORAL | 3 refills | Status: DC

## 2021-05-30 NOTE — Progress Notes (Signed)
? ? ?Chief Complaint: FU hospitalization ? ?Referring Provider:  [name], MD    ? ? ?ASSESSMENT AND PLAN;  ? ?#1.  Episodic Abdo pain. Neg extensive GI WU (Dr. Lavina Hamman) including neg EGD/colon 09/2018, 05/2015, neg SB series 10/2016.  Neg CT Abdo/pelvis.  ? ?#2. IBS-D ? ?#3. GERD ? ? ?Plan: ?-Bentyl '10mg'$  po BID #60, 6RF ?-CBC, CMP, Mg, celiac serology, TSH, CRP.  ?-protonix '40mg'$  po QD #90, 4RF ?-FU in 6 months. ?-SB push enteroscopy once off Eliquis (hopefully) ?-He will FU with pulmonary regarding PE ?-If continued problems and no etiology is found, would look for rare conditions (including 24-hour urine for 5-HIAA, VMA, porphobilinogen.  Also would eval for mastocytosis) ?-D/W pt and pt's wife. ? ? ?HPI:   ? ?Christopher Francis is a 76 y.o. male  ?With dVT/PE on Eliquis, HTN, DM2, HLD, remote right parietal cortex infarct ? ?Episodic severe Abdo pain followed by diarrhea followed by syncope after sweating profusely ?Since high school ?Discrete attacks, not frequent (4 distinct episodes over the last 12 years) ?Has longstanding chronic intermittent diarrhea (with rare constipation), attributed to IBS.  Has been on Bentyl with some relief in the past. ? ?Seen multiple physicians ? ?Had extensive GI WU performed by Dr. Lavina Hamman including neg EGD/colon 09/2018, 05/2015, neg SB series 10/2016.  Neg CT Abdo/pelvis.  He was planning SB push enteroscopy before he retired.  ? ?Had cardiac evaluation by Dr. Burt Knack -  thought to be vasovagal syncope. ? ?Adm to [facility] 2/20- 04/15/2021 with similar episode.  ?Req intubation, EMS witnessed SZ like activity ?neg neurology work-up including neg EEG for any seizure activity, CT head. 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  He was loaded and continued on Keppra ?Stool tested positive for norovirus. Managed conservatively ?Had acute bilateral DVT with PE (on CT chest), started on Eliquis ?TEE with elevated PAP, dilated  LA/RA. ? ? ? ?SH-married, daughter works in [employer]. ?Past Medical History:  ?Diagnosis Date  ? Gastroesophageal reflux disease   ? Hemorrhoids   ? Hypertension   ? ? ?Past Surgical History:  ?Procedure Laterality Date  ? APPENDECTOMY  2014  ? ? ?Family History  ?Problem Relation Age of Onset  ? Heart disease Father   ? Prostate cancer Father   ? Prostate cancer Brother   ? Colon cancer Neg Hx   ? Liver cancer Neg Hx   ? Stomach cancer Neg Hx   ? Pancreatic cancer Neg Hx   ? Esophageal cancer Neg Hx   ? Rectal cancer Neg Hx   ? ? ?Social History  ? ?Tobacco Use  ? Smoking status: Never  ?Vaping Use  ? Vaping Use: Never used  ?Substance Use Topics  ? Alcohol use: Yes  ?  Comment: rare  ? Drug use: Never  ? ? ?Current Outpatient Medications  ?Medication Sig Dispense Refill  ? apixaban (ELIQUIS) 5 MG TABS tablet Take 1 tablet (5 mg total) by mouth 2 (two) times daily. 10 mg twice daily for 7 days followed by 5 mg twice daily for 3 to 6 months 74 tablet 0  ? Cholecalciferol (VITAMIN D3 PO) Take 1 tablet by mouth every morning.    ? levETIRAcetam (KEPPRA) 500 MG tablet Take 1 tablet (500 mg total) by mouth 2 (two) times daily. 60 tablet 2  ? lisinopril (ZESTRIL) 10 MG tablet Take 2 tablets (20 mg total) by mouth every morning. (Patient taking differently: Take 10  mg by mouth every morning.) 60 tablet 2  ? pantoprazole (PROTONIX) 40 MG tablet Take 1 tablet (40 mg total) by mouth 2 (two) times daily. 60 tablet 2  ? ?No current facility-administered medications for this visit.  ? ? ?Allergies  ?Allergen Reactions  ? Tape Other (See Comments)  ?  Adhesive tape caused blisters  ? ? ?Review of Systems:  ?Constitutional: Denies fever, chills, diaphoresis, appetite change and fatigue.  ?HEENT: Denies photophobia, eye pain, redness, hearing loss, ear pain, congestion, sore throat, rhinorrhea, sneezing, mouth sores, neck pain, neck stiffness and tinnitus.   ?Respiratory: Denies SOB, DOE, cough, chest tightness,  and wheezing.    ?Cardiovascular: Denies chest pain, palpitations and leg swelling.  ?Genitourinary: Denies dysuria, urgency, frequency, hematuria, flank pain and difficulty urinating.  ?Musculoskeletal: Denies myalgias, back pain, joint swelling, arthralgias and gait problem.  ?Skin: No rash.  ?Neurological: Denies dizziness, seizures, syncope, weakness, light-headedness, numbness and headaches.  ?Hematological: Denies adenopathy. Easy bruising, personal or family bleeding history  ?Psychiatric/Behavioral: No anxiety or depression ? ?  ? ?Physical Exam:   ? ?BP 140/86   Pulse 85   Ht '5\' 8"'$  (1.727 m)   Wt 183 lb 4 oz (83.1 kg)   SpO2 96%   BMI 27.86 kg/m?  ?Wt Readings from Last 3 Encounters:  ?05/30/21 183 lb 4 oz (83.1 kg)  ?05/02/21 178 lb (80.7 kg)  ?04/14/21 194 lb 10.7 oz (88.3 kg)  ? ?Constitutional:  Well-developed, in no acute distress. ?Psychiatric: Normal mood and affect. Behavior is normal. ?HEENT: Pupils normal.  Conjunctivae are normal. No scleral icterus. ?Neck supple.  ?Cardiovascular: Normal rate, regular rhythm. No edema ?Pulmonary/chest: Effort normal and breath sounds normal. No wheezing, rales or rhonchi. ?Abdominal: Soft, nondistended. Nontender. Bowel sounds active throughout. There are no masses palpable. No hepatomegaly. ?Rectal: Deferred ?Neurological: Alert and oriented to person place and time. ?Skin: Skin is warm and dry. No rashes noted. ? ?Data Reviewed: I have personally reviewed following labs and imaging studies ? ?CBC: ? ?  Latest Ref Rng & Units 04/15/2021  ?  1:56 AM 04/14/2021  ?  7:24 AM 04/13/2021  ?  6:50 AM  ?CBC  ?WBC 4.0 - 10.5 K/uL 5.9   7.3   5.7    ?Hemoglobin 13.0 - 17.0 g/dL 11.5   10.5   9.8    ?Hematocrit 39.0 - 52.0 % 33.7   32.2   30.5    ?Platelets 150 - 400 K/uL 124   130   123    ? ? ?CMP: ? ?  Latest Ref Rng & Units 04/15/2021  ?  1:56 AM 04/14/2021  ?  7:24 AM 04/13/2021  ?  6:50 AM  ?CMP  ?Glucose 70 - 99 mg/dL 134   122   95    ?BUN 8 - 23 mg/dL '22   25   26    '$ ?Creatinine  0.61 - 1.24 mg/dL 1.07   1.17   1.32    ?Sodium 135 - 145 mmol/L 139   139   140    ?Potassium 3.5 - 5.1 mmol/L 3.0   3.6   3.9    ?Chloride 98 - 111 mmol/L 101   104   106    ?CO2 22 - 32 mmol/L '26   23   26    '$ ?Calcium 8.9 - 10.3 mg/dL 8.7   8.8   8.4    ? ?Extensive notes were reviewed ? ? ? ?Carmell Austria, MD 05/30/2021, 10:18  AM ? ?Cc: [name], MD ? ? ?

## 2021-05-30 NOTE — Patient Instructions (Addendum)
If you are age 75 or older, your body mass index should be between 23-30. Your Body mass index is 27.86 kg/m?Marland Kitchen If this is out of the aforementioned range listed, please consider follow up with your Primary Care Provider. ? ?If you are age 27 or younger, your body mass index should be between 19-25. Your Body mass index is 27.86 kg/m?Marland Kitchen If this is out of the aformentioned range listed, please consider follow up with your Primary Care Provider.  ? ?________________________________________________________ ? ?The [facility] providers would like to encourage you to use [facility] to communicate with providers for non-urgent requests or questions.  Due to long hold times on the telephone, sending your provider a message by [facility] may be a faster and more efficient way to get a response.  Please allow 48 business hours for a response.  Please remember that this is for non-urgent requests.  ?_______________________________________________________ ? ?Please go to the lab on the 2nd floor [address] before you leave the office today.  ? ?We have sent the following medications to your pharmacy for you to pick up at your convenience: ?Protonix and Bentyl ? ?Please call in 6 months to schedule follow up appointment. ? ?Please call with any questions or concerns. ? ?Thank you, ? ?Dr. Jackquline Denmark ? ? ? ? ? ?We want to thank you for trusting [facility] with your care. All of our staff and providers value the relationships we have built with our patients, and it is an honor to care for you.  ? ?We are writing to let you know that [facility] will close on Jul 03, 2021, and we invite you to continue to see Dr. Carmell Austria and Gerrit Heck at the [facility] [place] office location. We are consolidating our serices at these [facility] practices to better provide care. Our office staff will work with you to ensure a seamless transition.  ? ?Gerrit Heck, DO -Dr. Bryan Lemma  will be movig to [facility] at [address], [place], [place] [address], effective Jul 03, 2021.  Contact [phone] to schedule an appointment with him.  ? ?Carmell Austria, MD- Dr. Lyndel Safe will be movig to [facility] at [address] [place], [place] [address], effective Jul 03, 2021.  Contact [phone] to schedule an appointment with him.  ? ?Requesting Medical Records ?If you need to request your medical records, please follow the instructions below. Your medical records are confidential, and a copy can be transferred to another provider or released to you or another person you designate only with your permission. ? ?There are several ways to request your medical records: ?Requests for medical records can be submitted through our practice.   ?You can also request your records electronically, in your MyChart account by selecting the ?Request Health Records? tab.  ?If you need additional information on how to request records, please go to [email or web address]/, choose Patient Information, then select Request Medical Records. ?To make an appointment or if you have any questions about your health care needs, please contact our office at [phone] and one of our staff members will be glad to assist you. ?[facility] is committed to providing exceptional care for you and our community. Thank you for allowing [place] to serve your health care needs. ?Sincerely, ? ?Windy Canny, Director [facility] ? also offers convenient virtual care options. Sore throat? Sinus problems? Cold or flu symptoms? Get care from the comfort of home with [facility] Video  Visits and e-Visits. Learn more about the non-emergency conditions treated and start your virtual visit at [email or web address] ? ?

## 2021-06-01 LAB — CELIAC PANEL 10
Antigliadin Abs, IgA: 3 units (ref 0–19)
Endomysial IgA: NEGATIVE
Gliadin IgG: 3 units (ref 0–19)
IgA/Immunoglobulin A, Serum: 101 mg/dL (ref 61–437)
Tissue Transglut Ab: 5 U/mL (ref 0–5)
Transglutaminase IgA: <2 U/mL (ref 0–3)

## 2021-06-12 ENCOUNTER — Telehealth: Payer: Self-pay

## 2021-06-12 NOTE — Telephone Encounter (Signed)
-----   Message from Sherren Mocha, MD sent at 06/12/2021  3:38 PM EDT ----- ?Jasmine Pang - can you get him on for one of my next clinics? Maybe Monday 5/1 at 12pm. Thx! ? ?

## 2021-06-12 NOTE — Telephone Encounter (Signed)
Called and scheduled pt for 5/1 '@12'$ . ?

## 2021-06-19 ENCOUNTER — Ambulatory Visit (INDEPENDENT_AMBULATORY_CARE_PROVIDER_SITE_OTHER): Payer: Medicare (Managed Care) | Admitting: Cardiovascular Disease

## 2021-06-19 ENCOUNTER — Encounter: Payer: Self-pay | Admitting: Cardiovascular Disease

## 2021-06-19 VITALS — BP 146/96 | HR 80 | Ht 68.0 in | Wt 183.4 lb

## 2021-06-19 DIAGNOSIS — I10 Essential (primary) hypertension: Secondary | ICD-10-CM

## 2021-06-19 DIAGNOSIS — I272 Pulmonary hypertension, unspecified: Secondary | ICD-10-CM

## 2021-06-19 DIAGNOSIS — R55 Syncope and collapse: Secondary | ICD-10-CM

## 2021-06-19 MED ORDER — AMLODIPINE BESYLATE 5 MG PO TABS: 5.0000 mg | ORAL_TABLET | Freq: Every day | ORAL | 3 refills | Status: DC

## 2021-06-19 NOTE — Patient Instructions (Signed)
Medication Instructions:  ?STOP Lisinopril ?START Amlodipine '5mg'$  daily ?*If you need a refill on your cardiac medications before your next appointment, please call your pharmacy* ? ? ?Lab Work: ?NONE ?If you have labs (blood work) drawn today and your tests are completely normal, you will receive your results only by: ?MyChart Message (if you have MyChart) OR ?A paper copy in the mail ?If you have any lab test that is abnormal or we need to change your treatment, we will call you to review the results. ? ? ?Testing/Procedures: ?ECHO ?Your physician has requested that you have an echocardiogram. Echocardiography is a painless test that uses sound waves to create images of your heart. It provides your doctor with information about the size and shape of your heart and how well your heart?s chambers and valves are working. This procedure takes approximately one hour. There are no restrictions for this procedure. ? ?Follow-Up: ?At [facility], you and your health needs are our priority.  As part of our continuing mission to provide you with exceptional heart care, we have created designated Provider Care Teams.  These Care Teams include your primary Cardiologist (physician) and Advanced Practice Providers (APPs -  Physician Assistants and Nurse Practitioners) who all work together to provide you with the care you need, when you need it. ? ? ?Your next appointment:   ?1 year(s) ? ?The format for your next appointment:   ?In Person ? ?Provider:   ?Sherren Mocha, MD  ? ?Important Information About [facility] ? ? ? ? ?  ?

## 2021-06-19 NOTE — Progress Notes (Signed)
?Cardiology Office Note:   ? ?Date:  06/19/2021  ? ?ID:  Christopher Francis, DOB [birth year], MRN [id number] ? ?PCP:  [name], MD ?  ?[facility] Providers ?Cardiologist:  Sherren Mocha, MD    ? ?Referring MD: [name], MD  ? ?Chief Complaint  ?Patient presents with  ? Shortness of Breath  ? ? ?History of Present Illness:   ? ?Christopher Francis is a 76 y.o. male with a hx of recurrent syncope, presenting for cardiac evaluation.  I last saw the patient approximately 10 years ago.  He is the stepfather of one of our cardiac Cath Lab nurses. ? ?The patient is here with his wife today.  He recently was hospitalized after a syncopal episode occurring at home.  The patient had decreased level of consciousness after this episode that required intubation and mechanical ventilation.  He weaned from the ventilator fairly rapidly and was extubated.  This event occurred in February of this year.  He also was noted to have pulmonary emboli at the time of his event.  He has a history of recurrent syncope that first happened when he was in high school.  He had 3 events in high school and over the last 15 years he has had about 4 more syncopal episodes.  Although that his episodes have occurred in the context of abdominal pain or gastrointestinal symptoms.  He has never had frank syncope without warning or during any of his routine physical activities.  His syncopal episodes have occurred after he has experienced abdominal discomfort, nausea, or vomiting.  He has no history of documented arrhythmia.  He has longstanding hypertension and has been treated with lisinopril.  He was seen by pulmonary last month due to a chronic cough and symptoms actually improved with decreasing his lisinopril dose.  He reports longstanding whitecoat hypertension and states his home blood pressures are very rarely above 135/90.  The patient denies exertional chest pain or pressure, exertional dyspnea, orthopnea, PND, or heart  palpitations. ? ?Past Medical History:  ?Diagnosis Date  ? Gastroesophageal reflux disease   ? Hemorrhoids   ? Hypertension   ? ? ?Past Surgical History:  ?Procedure Laterality Date  ? APPENDECTOMY  2014  ? ? ?Current Medications: ?Current Meds  ?Medication Sig  ? amLODipine (NORVASC) 5 MG tablet Take 1 tablet (5 mg total) by mouth daily.  ? apixaban (ELIQUIS) 5 MG TABS tablet Take 1 tablet (5 mg total) by mouth 2 (two) times daily. 10 mg twice daily for 7 days followed by 5 mg twice daily for 3 to 6 months  ? Cholecalciferol (VITAMIN D3 PO) Take 1 tablet by mouth every morning.  ? dicyclomine (BENTYL) 10 MG capsule Take 1 capsule (10 mg total) by mouth 4 (four) times daily -  before meals and at bedtime.  ? pantoprazole (PROTONIX) 40 MG tablet Take 1 tablet (40 mg total) by mouth daily.  ? [DISCONTINUED] lisinopril (ZESTRIL) 20 MG tablet Take 20 mg by mouth daily.  ?  ? ?Allergies:   Tape  ? ?Social History  ? ?Socioeconomic History  ? Marital status: Married  ?  Spouse name: Not on file  ? Number of children: 2  ? Years of education: Not on file  ? Highest education level: Not on file  ?Occupational History  ? Occupation: retired  ?Tobacco Use  ? Smoking status: Never  ? Smokeless tobacco: Not on file  ?Vaping Use  ? Vaping Use: Never used  ?Substance and Sexual Activity  ?  Alcohol use: Yes  ?  Comment: rare  ? Drug use: Never  ? Sexual activity: Not on file  ?Other Topics Concern  ? Not on file  ?Social History Narrative  ? The patient is retired. He is married. He does not smoke cigarettes or drink alcohol.  ? ?Social Determinants of Health  ? ?Financial Resource Strain: Not on file  ?Food Insecurity: Not on file  ?Transportation Needs: Not on file  ?Physical Activity: Not on file  ?Stress: Not on file  ?Social Connections: Not on file  ?  ? ?Family History: ?The patient's family history includes Heart disease in his father; Prostate cancer in his brother and father. There is no history of Colon cancer, Liver  cancer, Stomach cancer, Pancreatic cancer, Esophageal cancer, or Rectal cancer. ? ?ROS:   ?Please see the history of present illness.    ?All other systems reviewed and are negative. ? ?EKGs/Labs/Other Studies Reviewed:   ? ?The following studies were reviewed today: ?Echo 04/14/21: ?1. Left ventricular ejection fraction, by estimation, is 55 to 60%. The  ?left ventricle has normal function. The left ventricle has no regional  ?wall motion abnormalities. Left ventricular diastolic parameters are  ?consistent with Grade I diastolic  ?dysfunction (impaired relaxation).  ? 2. Right ventricular systolic function is normal. The right ventricular  ?size is normal. There is moderately elevated pulmonary artery systolic  ?pressure.  ? 3. Left atrial size was moderately dilated.  ? 4. Right atrial size was mildly dilated.  ? 5. The mitral valve is normal in structure. Trivial mitral valve  ?regurgitation. No evidence of mitral stenosis.  ? 6. The aortic valve is normal in structure. Aortic valve regurgitation is  ?not visualized. Aortic valve sclerosis/calcification is present, without  ?any evidence of aortic stenosis.  ? 7. The inferior vena cava is dilated in size with >50% respiratory  ?variability, suggesting right atrial pressure of 8 mmHg.  ? ?EKG:  EKG is not ordered today.  ? ?Recent Labs: ?05/30/2021: ALT 12; BUN 15; Creatinine, Ser 1.27; Hemoglobin 13.4; Magnesium 1.8; Platelets 218.0; Potassium 4.6; Sodium 138; TSH 2.98  ?Recent Lipid Panel ?   ?Component Value Date/Time  ? CHOL 153 04/15/2021 0156  ? TRIG 176 (H) 04/15/2021 0156  ? HDL 23 (L) 04/15/2021 0156  ? CHOLHDL 6.7 04/15/2021 0156  ? VLDL 35 04/15/2021 0156  ? [place] 95 04/15/2021 0156  ? ? ? ?Risk Assessment/Calculations:   ?  ? ?    ? ?Physical Exam:   ? ?VS:  BP (!) 146/96   Pulse 80   Ht '5\' 8"'$  (1.727 m)   Wt 183 lb 6.4 oz (83.2 kg)   SpO2 96%   BMI 27.89 kg/m?    ? ?Wt Readings from Last 3 Encounters:  ?06/19/21 183 lb 6.4 oz (83.2 kg)  ?05/30/21  183 lb 4 oz (83.1 kg)  ?05/02/21 178 lb (80.7 kg)  ?  ? ?GEN:  Well nourished, well developed in no acute distress ?HEENT: Normal ?NECK: No JVD; No carotid bruits ?LYMPHATICS: No lymphadenopathy ?CARDIAC: RRR, no murmurs, rubs, gallops ?RESPIRATORY:  Clear to auscultation without rales, wheezing or rhonchi  ?ABDOMEN: Soft, non-tender, non-distended ?MUSCULOSKELETAL:  No edema; No deformity  ?SKIN: Warm and dry ?NEUROLOGIC:  Alert and oriented x 3 ?PSYCHIATRIC:  Normal affect  ? ?ASSESSMENT:   ? ?1. Essential hypertension   ?2. Pulmonary hypertension, unspecified ([place])   ?3. Syncope and collapse   ? ?PLAN:   ? ?In order of problems listed  above: ? ?Intermittent cough noted with lisinopril.  Seems to be tolerating the lower dose better, but I am inclined to switch him to amlodipine to see if he tolerates this better in general.  He will stop lisinopril and start amlodipine 5 mg daily.  Advised him to hang onto his lisinopril in case he needs to add it back at a lower dose.  He will monitor his blood pressure at least a few times weekly. ?This is seen on his echocardiogram performed in the hospital.  Peak tricuspid regurgitation velocity is 3.4 m/s with an estimated RVSP of 55 mmHg.  This is likely related to his pulmonary embolism and acute illness.  I have recommended a repeat echocardiogram to reassess.  He did have normal RV size and function at the time of his echo evaluation. ?Appear to be vagally mediated, always occurring in the context of abdominal pain, nausea, or vomiting.  Other than maintaining adequate hydration, and taking safety precautions, I do not know of any specific treatment to help him with this.  His symptoms do not have typical features of arrhythmia or seizure.  ? ?   ? ?Dispo: check 2D echo to reassess RV/PA pressures, antihypertensive medication changes as above, follow-up one year.    ? ? ?Medication Adjustments/Labs and Tests Ordered: ?Current medicines are reviewed at length with the  patient today.  Concerns regarding medicines are outlined above.  ?Orders Placed This Encounter  ?Procedures  ? ECHOCARDIOGRAM COMPLETE  ? ?Meds ordered this encounter  ?Medications  ? amLODipine (NORVASC) 5 MG tablet  ?  Sig:

## 2021-06-28 ENCOUNTER — Encounter: Payer: Self-pay | Admitting: Gastroenterology

## 2021-06-28 ENCOUNTER — Other Ambulatory Visit: Payer: Self-pay

## 2021-06-28 DIAGNOSIS — R109 Unspecified abdominal pain: Secondary | ICD-10-CM

## 2021-06-28 MED ORDER — DICYCLOMINE HCL 10 MG PO CAPS: 10.0000 mg | ORAL_CAPSULE | Freq: Two times a day (BID) | ORAL | 5 refills | Status: DC

## 2021-06-28 NOTE — Telephone Encounter (Signed)
A new prescription was placed with the Recommendations of recent office visit. Bentyl 10 mg po BID # 60  5 refills given  ?Pt made aware ?

## 2021-07-03 ENCOUNTER — Ambulatory Visit: Payer: Medicare (Managed Care) | Admitting: Pulmonary Disease

## 2021-07-03 ENCOUNTER — Encounter: Payer: Self-pay | Admitting: Cardiovascular Disease

## 2021-07-03 NOTE — Telephone Encounter (Signed)
Per OV note on 06/19/21: ?Intermittent cough noted with lisinopril.  Seems to be tolerating the lower dose better, but I am inclined to switch him to amlodipine to see if he tolerates this better in general.  He will stop lisinopril and start amlodipine 5 mg daily.  Advised him to hang onto his lisinopril in case he needs to add it back at a lower dose.  He will monitor his blood pressure at least a few times weekly. ? ?Will route pt's message and the above to [place] for review. Seems that pressures are about the same as when seen in office (146/96) with the Amlodipine. Likely needs to add in a lower dose Lisinopril to get closer to normotensive. ?

## 2021-07-04 MED ORDER — LOSARTAN POTASSIUM 50 MG PO TABS: 50.0000 mg | ORAL_TABLET | Freq: Every day | ORAL | 3 refills | Status: DC

## 2021-07-04 NOTE — Addendum Note (Signed)
Addended by: Ma Hillock on: 07/04/2021 05:06 PM ? ? Modules accepted: Orders ? ?

## 2021-07-04 NOTE — Telephone Encounter (Signed)
Please add losartan 50 mg daily and continue amlodipine 5 mg daily. thanks ?

## 2021-07-06 ENCOUNTER — Ambulatory Visit (HOSPITAL_COMMUNITY): Payer: Medicare (Managed Care) | Attending: Cardiovascular Disease

## 2021-07-06 DIAGNOSIS — I272 Pulmonary hypertension, unspecified: Secondary | ICD-10-CM | POA: Diagnosis present

## 2021-07-06 LAB — ECHOCARDIOGRAM COMPLETE
Area-P 1/2: 3.75 cm2
S' Lateral: 2.1 cm

## 2021-07-19 ENCOUNTER — Ambulatory Visit (INDEPENDENT_AMBULATORY_CARE_PROVIDER_SITE_OTHER): Payer: Medicare (Managed Care) | Admitting: Pulmonary Disease

## 2021-07-19 ENCOUNTER — Encounter: Payer: Self-pay | Admitting: Pulmonary Disease

## 2021-07-19 VITALS — BP 140/76 | HR 93 | Temp 98.6°F | Ht 68.0 in | Wt 176.8 lb

## 2021-07-19 DIAGNOSIS — I2699 Other pulmonary embolism without acute cor pulmonale: Secondary | ICD-10-CM

## 2021-07-19 NOTE — Patient Instructions (Signed)
Glad you are better  Okay to stop Eliquis given improved symptoms and improved echocardiogram  Return to clinic as needed, please call if I can be of any help

## 2021-07-19 NOTE — Progress Notes (Signed)
$'@Patient'W$  ID: Christopher Francis, male    DOB: 08-07-1945, 76 y.o.   MRN: [id number]  Chief Complaint  Patient presents with   Follow-up    Pt is here for follow up for his pulm embolism. Pt states he is taking his Eliquis '5mg'$  twice a day. Echo was done on 5/18. Pt states no bleeding or bruising noted so far on medication.     Referring provider: [name], MD  HPI:   76 y.o. man whom we are seeing in follow-up after hospitalization and provoked pulmonary embolus.  Most recent cardiology note reviewed.   Patient returns for routine follow-up.  Dyspnea resolved.  Back to work in the yard.  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  Wife endorses he is doing much better.  Reviewed most recent TTE 06/2021 that shows resolution of RV dysfunction, normal estimated pressures.  Discussed at length this is great news.  The resolution of symptoms and normalization of his heart was indicates well treated pulmonary embolus.  He is completed over 3 months of anticoagulation for provoked PE.  About 4 months of completion now.  After discussion of risk and benefits we agree after shared decision making to discontinue apixaban.  HPI at initial visit: Patient was admitted to the hospital with encephalopathy, likely aspirated due to his encephalopathy was doubly intubated and brought to the ICU.  He was weaned from the ventilator and extubated.  Encephalopathy without clear cause, brain imaging, EEG clear.  He was ambulating around the ICU a few days into stay and noted to be hypoxemic to mid 80s.  This prompted CTA PE protocol that revealed pulmonary emboli.  TTE was obtained which showed normal RV size and function with elevated pulmonary artery pressures, dilated LA, dilated RA.  He continues on apixaban currently dyspnea is improving.  He is get on his feet, getting stamina back.  Chief complaint today is cough.  Present for the last couple weeks.  Only started while in the hospital.  Worse when he lies down  at night but also worsening lying down during the day.  He denies any frank heartburn and minimal reflux symptoms per his report.  Occasional nasal congestion but denies frank postnasal drip.  Notably, he has been on lisinopril for a while and recently increased per his report.  Reviewed chest x-ray 04/10/2021 that shows clear lungs bilaterally on my interpretation.  Reviewed chest x-ray 04/14/2020 shows bilateral small pleural effusions, on my interpretation, CTA PE protocol 2/24-23 shows pulmonary embolus and bilateral pleural effusions on my interpretation.  Questionaires / Pulmonary Flowsheets:   ACT:      View : No data to display.          MMRC:     View : No data to display.          [place]:      View : No data to display.          Tests:   FENO:  No results found for: NITRICOXIDE  PFT:     View : No data to display.          WALK:      View : No data to display.          Imaging: Personally reviewed and as per EMR and discussion in this note ECHOCARDIOGRAM COMPLETE  Result Date: 07/06/2021    ECHOCARDIOGRAM REPORT   Patient Name:   Christopher Francis  Date of Exam: 07/06/2021 Medical Rec #:  [id number]  Height:       68.0 in Accession #:    [id number]    Weight:       183.4 lb Date of Birth:  1945/03/29     BSA:          1.970 m Patient Age:    63 years      BP:           146/96 mmHg Patient Gender: M             HR:           88 bpm. Exam Location:  [facility] Procedure: 2D Echo, Cardiac Doppler and Color Doppler Indications:    I27.20 Pulmonary hypertension  History:        Patient has prior history of Echocardiogram examinations, most                 recent 04/14/2021. Risk Factors:Hypertension.  Sonographer:    Wilford Sports Rodgers-Jones RDCS Referring Phys: [place]  1. No evidence of pulmonary hypertension.  2. Left ventricular ejection fraction, by estimation, is 60 to 65%. Left ventricular ejection fraction by PLAX is 65 %. The  left ventricle has normal function. The left ventricle has no regional wall motion abnormalities. There is mild concentric left ventricular hypertrophy. Left ventricular diastolic parameters are consistent with Grade I diastolic dysfunction (impaired relaxation).  3. Right ventricular systolic function is normal. The right ventricular size is normal. Tricuspid regurgitation signal is inadequate for assessing PA pressure.  4. The mitral valve is normal in structure. Trivial mitral valve regurgitation. No evidence of mitral stenosis.  5. The aortic valve is tricuspid. Aortic valve regurgitation is not visualized. No aortic stenosis is present.  6. The inferior vena cava is normal in size with greater than 50% respiratory variability, suggesting right atrial pressure of 3 mmHg. FINDINGS  Left Ventricle: Left ventricular ejection fraction, by estimation, is 60 to 65%. Left ventricular ejection fraction by PLAX is 65 %. The left ventricle has normal function. The left ventricle has no regional wall motion abnormalities. The left ventricular internal cavity size was normal in size. There is mild concentric left ventricular hypertrophy. Left ventricular diastolic parameters are consistent with Grade I diastolic dysfunction (impaired relaxation). Normal left ventricular filling pressure. Right Ventricle: The right ventricular size is normal. No increase in right ventricular wall thickness. Right ventricular systolic function is normal. Tricuspid regurgitation signal is inadequate for assessing PA pressure. Left Atrium: Left atrial size was normal in size. Right Atrium: Right atrial size was normal in size. Pericardium: There is no evidence of pericardial effusion. Mitral Valve: The mitral valve is normal in structure. Trivial mitral valve regurgitation. No evidence of mitral valve stenosis. Tricuspid Valve: The tricuspid valve is normal in structure. Tricuspid valve regurgitation is trivial. No evidence of tricuspid  stenosis. Aortic Valve: The aortic valve is tricuspid. Aortic valve regurgitation is not visualized. No aortic stenosis is present. Pulmonic Valve: The pulmonic valve was normal in structure. Pulmonic valve regurgitation is trivial. No evidence of pulmonic stenosis. Aorta: The aortic root is normal in size and structure. Venous: The inferior vena cava is normal in size with greater than 50% respiratory variability, suggesting right atrial pressure of 3 mmHg. IAS/Shunts: No atrial level shunt detected by color flow Doppler.  LEFT VENTRICLE PLAX 2D LV EF:         Left            Diastology  ventricular     LV e' medial:    8.05 cm/s                ejection        LV E/e' medial:  8.5                fraction by     LV e' lateral:   9.14 cm/s                PLAX is 65      LV E/e' lateral: 7.5                %. LVIDd:         3.20 cm LVIDs:         2.10 cm LV PW:         1.20 cm LV IVS:        1.20 cm LVOT diam:     2.00 cm LV SV:         53 LV SV Index:   27 LVOT Area:     3.14 cm  RIGHT VENTRICLE             IVC RV Basal diam:  3.90 cm     IVC diam: 1.50 cm RV S prime:     18.00 cm/s TAPSE (M-mode): 2.7 cm LEFT ATRIUM             Index        RIGHT ATRIUM           Index LA diam:        4.20 cm 2.13 cm/m   RA Area:     13.10 cm LA Vol (A2C):   51.2 ml 25.99 ml/m  RA Volume:   32.50 ml  16.50 ml/m LA Vol (A4C):   41.9 ml 21.27 ml/m LA Biplane Vol: 46.5 ml 23.61 ml/m  AORTIC VALVE LVOT Vmax:   89.13 cm/s LVOT Vmean:  58.567 cm/s LVOT VTI:    0.168 m  AORTA Ao Root diam: 3.40 cm Ao Asc diam:  3.50 cm MITRAL VALVE MV Area (PHT): 3.75 cm    SHUNTS MV Decel Time: 203 msec    Systemic VTI:  0.17 m MV E velocity: 68.15 cm/s  Systemic Diam: 2.00 cm MV A velocity: 87.25 cm/s MV E/A ratio:  0.78 Skeet Latch MD Electronically signed by Skeet Latch MD Signature Date/Time: 07/06/2021/2:14:31 PM    Final     Lab Results: Personally reviewed CBC    Component Value Date/Time   WBC 4.0 05/30/2021  1055   RBC 5.07 05/30/2021 1055   HGB 13.4 05/30/2021 1055   HCT 41.8 05/30/2021 1055   PLT 218.0 05/30/2021 1055   MCV 82.5 05/30/2021 1055   MCH 27.8 04/15/2021 0156   MCHC 32.1 05/30/2021 1055   RDW 14.3 05/30/2021 1055   LYMPHSABS 1.0 05/30/2021 1055   MONOABS 0.4 05/30/2021 1055   EOSABS 0.1 05/30/2021 1055   BASOSABS 0.0 05/30/2021 1055    BMET    Component Value Date/Time   NA 138 05/30/2021 1055   K 4.6 05/30/2021 1055   CL 103 05/30/2021 1055   CO2 28 05/30/2021 1055   GLUCOSE 102 (H) 05/30/2021 1055   BUN 15 05/30/2021 1055   CREATININE 1.27 05/30/2021 1055   CREATININE 1.13 05/09/2015 0828   CALCIUM 9.9 05/30/2021 1055   GFRNONAA >60 04/15/2021 0156    BNP No results found for: BNP  ProBNP No results found  for: PROBNP  Specialty Problems       Pulmonary Problems   Aspiration into airway    Allergies  Allergen Reactions   Tape Other (See Comments)    Adhesive tape caused blisters     There is no immunization history on file for this patient.  Past Medical History:  Diagnosis Date   Gastroesophageal reflux disease    Hemorrhoids    Hypertension     Tobacco History: Social History   Tobacco Use  Smoking Status Never  Smokeless Tobacco Not on file   Counseling given: Not Answered   Continue to not smoke  Outpatient Encounter Medications as of 07/19/2021  Medication Sig   amLODipine (NORVASC) 5 MG tablet Take 1 tablet (5 mg total) by mouth daily.   Cholecalciferol (VITAMIN D3 PO) Take 1 tablet by mouth every morning.   dicyclomine (BENTYL) 10 MG capsule Take 1 capsule (10 mg total) by mouth 2 (two) times daily.   losartan (COZAAR) 50 MG tablet Take 1 tablet (50 mg total) by mouth daily.   pantoprazole (PROTONIX) 40 MG tablet Take 1 tablet (40 mg total) by mouth daily.   [DISCONTINUED] apixaban (ELIQUIS) 5 MG TABS tablet Take 1 tablet (5 mg total) by mouth 2 (two) times daily. 10 mg twice daily for 7 days followed by 5 mg twice daily  for 3 to 6 months   [DISCONTINUED] levETIRAcetam (KEPPRA) 500 MG tablet Take 1 tablet (500 mg total) by mouth 2 (two) times daily. (Patient not taking: Reported on 06/19/2021)   No facility-administered encounter medications on file as of 07/19/2021.     Review of Systems  Review of Systems  N/a Physical Exam  BP 140/76 (BP Location: Left Arm, Patient Position: Sitting, Cuff Size: Normal)   Pulse 93   Temp 98.6 F (37 C) (Oral)   Ht '5\' 8"'$  (1.727 m)   Wt 176 lb 12.8 oz (80.2 kg)   SpO2 97%   BMI 26.88 kg/m   Wt Readings from Last 5 Encounters:  07/19/21 176 lb 12.8 oz (80.2 kg)  06/19/21 183 lb 6.4 oz (83.2 kg)  05/30/21 183 lb 4 oz (83.1 kg)  05/02/21 178 lb (80.7 kg)  04/14/21 194 lb 10.7 oz (88.3 kg)    BMI Readings from Last 5 Encounters:  07/19/21 26.88 kg/m  06/19/21 27.89 kg/m  05/30/21 27.86 kg/m  05/02/21 27.06 kg/m  04/14/21 29.17 kg/m     Physical Exam General: Well-appearing, no acute distress Eyes: EOMI, icterus Neck: Supple, no JVP Pulmonary: Clear, normal work of breathing Cardiovascular: Regular in rhythm, no murmurs Abdomen: Nondistended, sounds present MSK: No synovitis, joint effusion Neuro: Normal gait, no weakness Psych: Normal mood, full affect   Assessment & Plan:   Provoked pulmonary embolus: In setting of critical illness. Initial inpatient TTE revealed elevated pulmonary pressure. Repeat TTE with resolution of these findings. Ok to discontinue AC now ~4 months on [facility].   Elevated pulmonary pressures: Likely multifactorial in the setting of acute PE, left atrial is dilated which indicates likely group 2 component of pulmonary hypertension if present.  Repeat TTE 06/2021 with resolution of R sided findings. No further evaluation indicated at this time.  Cough: Present for the last few weeks since hospitalization.  Lisinopril dose has been increased.  Poss related to ACE inhibitor.  Improved with PPI therapy.  Dyspnea on exertion:  Suspect deconditioning.  Resolved with increasing exercise. No further work up.   Return if symptoms worsen or fail to improve.  Lanier Clam, MD 07/19/2021

## 2022-02-23 ENCOUNTER — Ambulatory Visit (INDEPENDENT_AMBULATORY_CARE_PROVIDER_SITE_OTHER): Payer: Medicare (Managed Care) | Admitting: Gastroenterology

## 2022-02-23 ENCOUNTER — Encounter: Payer: Self-pay | Admitting: Gastroenterology

## 2022-02-23 VITALS — BP 156/86 | Ht 68.0 in | Wt 183.0 lb

## 2022-02-23 DIAGNOSIS — K219 Gastro-esophageal reflux disease without esophagitis: Secondary | ICD-10-CM

## 2022-02-23 DIAGNOSIS — R109 Unspecified abdominal pain: Secondary | ICD-10-CM

## 2022-02-23 DIAGNOSIS — Z86010 Personal history of colonic polyps: Secondary | ICD-10-CM

## 2022-02-23 DIAGNOSIS — K580 Irritable bowel syndrome with diarrhea: Secondary | ICD-10-CM

## 2022-02-23 MED ORDER — DICYCLOMINE HCL 10 MG PO CAPS: 10.0000 mg | ORAL_CAPSULE | Freq: Two times a day (BID) | ORAL | 4 refills | Status: DC

## 2022-02-23 MED ORDER — PANTOPRAZOLE SODIUM 40 MG PO TBEC: 40.0000 mg | DELAYED_RELEASE_TABLET | Freq: Every day | ORAL | 3 refills | Status: DC

## 2022-02-23 NOTE — Progress Notes (Signed)
Chief Complaint: For GI eval.  Referring Provider:  [name], MD      ASSESSMENT AND PLAN;   #1.  H/O polyps   #2. IBS-D. Neg celiac serology, Nl TSH 05/2021  #3. GERD despite protonix  #4. H/O Episodic Abdo pain (resolved). Neg extensive GI WU (Dr. Lavina Hamman) including neg EGD/colon 09/2018, 05/2015, neg SB series 10/2016.  Neg CT Abdo/pelvis.    Plan:  -EGD/Colon with miralax -protonix '40mg'$  po QD #90, 4RF -Continue bentyl '10mg'$  BID #180, 4RF    I discussed EGD/Colonoscopy- the indications, risks, alternatives and potential complications including, but not limited to bleeding, infection, reaction to meds, damage to internal organs, cardiac and/or pulmonary problems, and perforation requiring surgery. The possibility that significant findings could be missed was explained. All ? were answered. Pt consents to proceed.   HPI:    Christopher Francis is a 77 y.o. 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  Doing much better. Off Eliquis Hb 13 (was 11.26 Mar 2021) with Nl Plts  Got letter from prev GI as he is due for colon for FU colon polyps.  No diarrhea with bentyl But has sensation of incomplete eval and occ incontinence. Has hoids- occ rectal bleeding.  Resolved after stopping Eliquis Nl Hb  Has also been complaining of increasing heartburn despite Protonix.  Has breakthrough symptoms requiring Tums.  No odynophagia or dysphagia  No further episodes of abdominal pain.        From previous notes: Had extensive GI WU performed by Dr. Lavina Hamman including neg EGD/colon 09/2018 except for polyps, 05/2015, neg SB series 10/2016.  Neg CT Abdo/pelvis.  He was planning SB push enteroscopy before he retired.  Had cardiac evaluation by Dr. Burt Knack -  thought to be vasovagal syncope. Adm to [facility] 2/20- 04/15/2021 with similar episode.  Req intubation, EMS witnessed SZ like  activity neg neurology work-up including neg EEG for any seizure activity, CT head. 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  He was loaded and continued on Keppra Stool tested positive for norovirus. Managed conservatively Had acute bilateral DVT with PE (on CT chest), started on Eliquis TEE with elevated PAP, dilated LA/RA. Rpt eval- Nl. Off eliquis     SH-married, daughter works in [employer]. Past Medical History:  Diagnosis Date   Gastroesophageal reflux disease    Hemorrhoids    Hypertension     Past Surgical History:  Procedure Laterality Date   APPENDECTOMY  2014    Family History  Problem Relation Age of Onset   Heart disease Father    Prostate cancer Father    Prostate cancer Brother    Colon cancer Neg Hx    Liver cancer Neg Hx    Stomach cancer Neg Hx    Pancreatic cancer Neg Hx    Esophageal cancer Neg Hx    Rectal cancer Neg Hx     Social History   Tobacco Use   Smoking status: Never   Smokeless tobacco: Never  Vaping Use   Vaping Use: Never used  Substance Use Topics   Alcohol use: Yes    Comment: rare   Drug use: Never    Current Outpatient Medications  Medication Sig Dispense Refill   amLODipine (NORVASC) 5 MG tablet Take 1 tablet (5 mg total) by mouth daily. 90 tablet 3   Cholecalciferol (  VITAMIN D3 PO) Take 1 tablet by mouth every morning.     dicyclomine (BENTYL) 10 MG capsule Take 1 capsule (10 mg total) by mouth 2 (two) times daily. 60 capsule 5   losartan (COZAAR) 50 MG tablet Take 1 tablet (50 mg total) by mouth daily. 90 tablet 3   pantoprazole (PROTONIX) 40 MG tablet Take 1 tablet (40 mg total) by mouth daily. 90 tablet 3   No current facility-administered medications for this visit.    Allergies  Allergen Reactions   Tape Other (See Comments)    Adhesive tape caused blisters    Review of Systems:  neg     Physical Exam:    BP (!) 156/86   Ht '5\' 8"'$  (1.727 m)   Wt 183  lb (83 kg)   BMI 27.83 kg/m  Wt Readings from Last 3 Encounters:  02/23/22 183 lb (83 kg)  07/19/21 176 lb 12.8 oz (80.2 kg)  06/19/21 183 lb 6.4 oz (83.2 kg)   Constitutional:  Well-developed, in no acute distress. Psychiatric: Normal mood and affect. Behavior is normal. HEENT: Pupils normal.  Conjunctivae are normal. No scleral icterus.  Cardiovascular: Normal rate, regular rhythm. No edema Pulmonary/chest: Effort normal and breath sounds normal. No wheezing, rales or rhonchi. Abdominal: Soft, nondistended. Nontender. Bowel sounds active throughout. There are no masses palpable. No hepatomegaly. Rectal: Deferred Neurological: Alert and oriented to person place and time. Skin: Skin is warm and dry. No rashes noted.  Data Reviewed: I have personally reviewed following labs and imaging studies  CBC:    Latest Ref Rng & Units 05/30/2021   10:55 AM 04/15/2021    1:56 AM 04/14/2021    7:24 AM  CBC  WBC 4.0 - 10.5 K/uL 4.0  5.9  7.3   Hemoglobin 13.0 - 17.0 g/dL 13.4  11.5  10.5   Hematocrit 39.0 - 52.0 % 41.8  33.7  32.2   Platelets 150.0 - 400.0 K/uL 218.0  124  130     CMP:    Latest Ref Rng & Units 05/30/2021   10:55 AM 04/15/2021    1:56 AM 04/14/2021    7:24 AM  CMP  Glucose 70 - 99 mg/dL 102  134  122   BUN 6 - 23 mg/dL '15  22  25   '$ Creatinine 0.40 - 1.50 mg/dL 1.27  1.07  1.17   Sodium 135 - 145 mEq/L 138  139  139   Potassium 3.5 - 5.1 mEq/L 4.6  3.0  3.6   Chloride 96 - 112 mEq/L 103  101  104   CO2 19 - 32 mEq/L '28  26  23   '$ Calcium 8.4 - 10.5 mg/dL 9.9  8.7  8.8   Total Protein 6.0 - 8.3 g/dL 6.6     Total Bilirubin 0.2 - 1.2 mg/dL 0.5     Alkaline Phos 39 - 117 U/L 63     AST 0 - 37 U/L 16     ALT 0 - 53 U/L 12          Carmell Austria, MD 02/23/2022, 11:06 AM  Cc: [name], MD

## 2022-02-23 NOTE — Patient Instructions (Addendum)
_______________________________________________________  If you are age 76 or older, your body mass index should be between 23-30. Your Body mass index is 27.83 kg/m. If this is out of the aforementioned range listed, please consider follow up with your Primary Care Provider.  If you are age 81 or younger, your body mass index should be between 19-25. Your Body mass index is 27.83 kg/m. If this is out of the aformentioned range listed, please consider follow up with your Primary Care Provider.   ________________________________________________________  The [facility] providers would like to encourage you to use [facility] to communicate with providers for non-urgent requests or questions.  Due to long hold times on the telephone, sending your provider a message by [facility] may be a faster and more efficient way to get a response.  Please allow 48 business hours for a response.  Please remember that this is for non-urgent requests.  _______________________________________________________  Dennis Bast have been scheduled for a colonoscopy. Please follow written instructions given to you at your visit today.  Please pick up your prep supplies at the pharmacy within the next 1-3 days. If you use inhalers (even only as needed), please bring them with you on the day of your procedure.  We have sent the following medications to your pharmacy for you to pick up at your convenience: Protonix  Bentyl  Thank you,  Dr. Jackquline Denmark

## 2022-04-23 ENCOUNTER — Encounter: Payer: Self-pay | Admitting: Gastroenterology

## 2022-04-23 ENCOUNTER — Ambulatory Visit (AMBULATORY_SURGERY_CENTER): Payer: Medicare (Managed Care) | Admitting: Gastroenterology

## 2022-04-23 VITALS — BP 134/92 | HR 71 | Temp 98.6°F | Resp 11 | Ht 68.0 in | Wt 183.0 lb

## 2022-04-23 DIAGNOSIS — Z86010 Personal history of colonic polyps: Secondary | ICD-10-CM

## 2022-04-23 DIAGNOSIS — K580 Irritable bowel syndrome with diarrhea: Secondary | ICD-10-CM

## 2022-04-23 DIAGNOSIS — Z09 Encounter for follow-up examination after completed treatment for conditions other than malignant neoplasm: Secondary | ICD-10-CM | POA: Diagnosis not present

## 2022-04-23 DIAGNOSIS — K317 Polyp of stomach and duodenum: Secondary | ICD-10-CM | POA: Diagnosis not present

## 2022-04-23 DIAGNOSIS — K2970 Gastritis, unspecified, without bleeding: Secondary | ICD-10-CM | POA: Diagnosis not present

## 2022-04-23 DIAGNOSIS — D123 Benign neoplasm of transverse colon: Secondary | ICD-10-CM | POA: Diagnosis not present

## 2022-04-23 DIAGNOSIS — D124 Benign neoplasm of descending colon: Secondary | ICD-10-CM

## 2022-04-23 DIAGNOSIS — D122 Benign neoplasm of ascending colon: Secondary | ICD-10-CM | POA: Diagnosis not present

## 2022-04-23 DIAGNOSIS — K219 Gastro-esophageal reflux disease without esophagitis: Secondary | ICD-10-CM | POA: Diagnosis not present

## 2022-04-23 DIAGNOSIS — K621 Rectal polyp: Secondary | ICD-10-CM | POA: Diagnosis not present

## 2022-04-23 DIAGNOSIS — R109 Unspecified abdominal pain: Secondary | ICD-10-CM

## 2022-04-23 DIAGNOSIS — K635 Polyp of colon: Secondary | ICD-10-CM | POA: Diagnosis not present

## 2022-04-23 HISTORY — PX: COLONOSCOPY WITH ESOPHAGOGASTRODUODENOSCOPY (EGD): SHX5779

## 2022-04-23 MED ORDER — SODIUM CHLORIDE 0.9 % IV SOLN: 500.0000 mL | Freq: Once | INTRAVENOUS | Status: DC

## 2022-04-23 MED ORDER — HYDROCORTISONE (PERIANAL) 2.5 % EX CREA: 1.0000 | TOPICAL_CREAM | Freq: Two times a day (BID) | CUTANEOUS | 1 refills | Status: DC

## 2022-04-23 NOTE — Progress Notes (Signed)
Pt's states no medical or surgical changes since previsit or office visit. 

## 2022-04-23 NOTE — Progress Notes (Signed)
Called to room to assist during endoscopic procedure.  Patient ID and intended procedure confirmed with present staff. Received instructions for my participation in the procedure from the performing physician.  

## 2022-04-23 NOTE — Progress Notes (Signed)
A and O x3. Report to RN. Tolerated MAC anesthesia well.Teeth unchanged after procedure. 

## 2022-04-23 NOTE — Progress Notes (Signed)
Chief Complaint: For GI eval.  Referring Provider:  [name], MD      ASSESSMENT AND PLAN;   #1.  H/O polyps   #2. IBS-D. Neg celiac serology, Nl TSH 05/2021  #3. GERD despite protonix  #4. H/O Episodic Abdo pain (resolved). Neg extensive GI WU (Dr. Lavina Hamman) including neg EGD/colon 09/2018, 05/2015, neg SB series 10/2016.  Neg CT Abdo/pelvis.    Plan:  -EGD/Colon with miralax -protonix '40mg'$  po QD #90, 4RF -Continue bentyl '10mg'$  BID #180, 4RF    I discussed EGD/Colonoscopy- the indications, risks, alternatives and potential complications including, but not limited to bleeding, infection, reaction to meds, damage to internal organs, cardiac and/or pulmonary problems, and perforation requiring surgery. The possibility that significant findings could be missed was explained. All ? were answered. Pt consents to proceed.   HPI:    Christopher Francis is a 77 y.o. 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  Doing much better. Off Eliquis Hb 13 (was 11.26 Mar 2021) with Nl Plts  Got letter from prev GI as he is due for colon for FU colon polyps.  No diarrhea with bentyl But has sensation of incomplete eval and occ incontinence. Has hoids- occ rectal bleeding.  Resolved after stopping Eliquis Nl Hb  Has also been complaining of increasing heartburn despite Protonix.  Has breakthrough symptoms requiring Tums.  No odynophagia or dysphagia  No further episodes of abdominal pain.        From previous notes: Had extensive GI WU performed by Dr. Lavina Hamman including neg EGD/colon 09/2018 except for polyps, 05/2015, neg SB series 10/2016.  Neg CT Abdo/pelvis.  He was planning SB push enteroscopy before he retired.  Had cardiac evaluation by Dr. Burt Knack -  thought to be vasovagal syncope. Adm to [facility] 2/20- 04/15/2021 with similar episode.  Req intubation, EMS witnessed SZ like  activity neg neurology work-up including neg EEG for any seizure activity, CT head. 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  He was loaded and continued on Keppra Stool tested positive for norovirus. Managed conservatively Had acute bilateral DVT with PE (on CT chest), started on Eliquis TEE with elevated PAP, dilated LA/RA. Rpt eval- Nl. Off eliquis     SH-married, daughter works in [employer]. Past Medical History:  Diagnosis Date   Gastroesophageal reflux disease    Hemorrhoids    Hypertension     Past Surgical History:  Procedure Laterality Date   APPENDECTOMY  02/20/2012   COLONOSCOPY     COLONOSCOPY WITH ESOPHAGOGASTRODUODENOSCOPY (EGD)  04/23/2022    Family History  Problem Relation Age of Onset   Heart disease Father    Prostate cancer Father    Prostate cancer Brother    Colon cancer Neg Hx    Liver cancer Neg Hx    Stomach cancer Neg Hx    Pancreatic cancer Neg Hx    Esophageal cancer Neg Hx    Rectal cancer Neg Hx     Social History   Tobacco Use   Smoking status: Never   Smokeless tobacco: Never  Vaping Use   Vaping Use: Never used  Substance Use Topics   Alcohol use: Yes    Comment: rare   Drug use: Never    Current Outpatient Medications  Medication Sig Dispense Refill   amLODipine (NORVASC) 5 MG tablet Take 1  tablet (5 mg total) by mouth daily. 90 tablet 3   Cholecalciferol 1.25 MG (50000 UT) capsule Take by mouth.     dicyclomine (BENTYL) 10 MG capsule Take 1 capsule (10 mg total) by mouth 2 (two) times daily. 180 capsule 4   losartan (COZAAR) 50 MG tablet Take 1 tablet (50 mg total) by mouth daily. 90 tablet 3   pantoprazole (PROTONIX) 40 MG tablet Take 1 tablet (40 mg total) by mouth daily. 90 tablet 3   triamcinolone cream (KENALOG) 0.1 % Apply topically 2 (two) times daily as needed.     Current Facility-Administered Medications  Medication Dose Route Frequency Provider Last Rate Last  Admin   0.9 %  sodium chloride infusion  500 mL Intravenous Once Jackquline Denmark, MD        Allergies  Allergen Reactions   Tape Other (See Comments)    Adhesive tape caused blisters    Review of Systems:  neg     Physical Exam:    BP (!) 192/85   Pulse 95   Temp 98.6 F (37 C) (Temporal)   Resp 10   Ht '5\' 8"'$  (1.727 m)   Wt 183 lb (83 kg)   SpO2 100%   BMI 27.83 kg/m  Wt Readings from Last 3 Encounters:  04/23/22 183 lb (83 kg)  02/23/22 183 lb (83 kg)  07/19/21 176 lb 12.8 oz (80.2 kg)   Constitutional:  Well-developed, in no acute distress. Psychiatric: Normal mood and affect. Behavior is normal. HEENT: Pupils normal.  Conjunctivae are normal. No scleral icterus.  Cardiovascular: Normal rate, regular rhythm. No edema Pulmonary/chest: Effort normal and breath sounds normal. No wheezing, rales or rhonchi. Abdominal: Soft, nondistended. Nontender. Bowel sounds active throughout. There are no masses palpable. No hepatomegaly. Rectal: Deferred Neurological: Alert and oriented to person place and time. Skin: Skin is warm and dry. No rashes noted.  Data Reviewed: I have personally reviewed following labs and imaging studies  CBC:    Latest Ref Rng & Units 05/30/2021   10:55 AM 04/15/2021    1:56 AM 04/14/2021    7:24 AM  CBC  WBC 4.0 - 10.5 K/uL 4.0  5.9  7.3   Hemoglobin 13.0 - 17.0 g/dL 13.4  11.5  10.5   Hematocrit 39.0 - 52.0 % 41.8  33.7  32.2   Platelets 150.0 - 400.0 K/uL 218.0  124  130     CMP:    Latest Ref Rng & Units 05/30/2021   10:55 AM 04/15/2021    1:56 AM 04/14/2021    7:24 AM  CMP  Glucose 70 - 99 mg/dL 102  134  122   BUN 6 - 23 mg/dL '15  22  25   '$ Creatinine 0.40 - 1.50 mg/dL 1.27  1.07  1.17   Sodium 135 - 145 mEq/L 138  139  139   Potassium 3.5 - 5.1 mEq/L 4.6  3.0  3.6   Chloride 96 - 112 mEq/L 103  101  104   CO2 19 - 32 mEq/L '28  26  23   '$ Calcium 8.4 - 10.5 mg/dL 9.9  8.7  8.8   Total Protein 6.0 - 8.3 g/dL 6.6     Total Bilirubin  0.2 - 1.2 mg/dL 0.5     Alkaline Phos 39 - 117 U/L 63     AST 0 - 37 U/L 16     ALT 0 - 53 U/L 12  Carmell Austria, MD 04/23/2022, 10:31 AM  Cc: [name], MD

## 2022-04-23 NOTE — Op Note (Signed)
[facility] Patient Name: Christopher Francis Procedure Date: 04/23/2022 10:30 AM MRN: [id number] Endoscopist: Jackquline Denmark , MD, [id number] Age: 77 Referring MD:  Date of Birth: 1945-12-27 Gender: Male Account #: [id number] Procedure:                Colonoscopy Indications:              High risk colon cancer surveillance: Personal                            history of colonic polyps. Intermittent diarrhea. Medicines:                Monitored Anesthesia Care Procedure:                Pre-Anesthesia Assessment:                           - Prior to the procedure, a History and Physical                            was performed, and patient medications and                            allergies were reviewed. The patient's tolerance of                            previous anesthesia was also reviewed. The risks                            and benefits of the procedure and the sedation                            options and risks were discussed with the patient.                            All questions were answered, and informed consent                            was obtained. Prior Anticoagulants: The patient has                            taken no anticoagulant or antiplatelet agents. ASA                            Grade Assessment: II - A patient with mild systemic                            disease. After reviewing the risks and benefits,                            the patient was deemed in satisfactory condition to                            undergo the procedure.  After obtaining informed consent, the colonoscope                            was passed under direct vision. Throughout the                            procedure, the patient's blood pressure, pulse, and                            oxygen saturations were monitored continuously. The                            Olympus [id number]-[id number] Colonoscope was                            introduced through  the anus and advanced to the 2                            cm into the ileum. The colonoscopy was performed                            without difficulty. The patient tolerated the                            procedure well. The quality of the bowel                            preparation was adequate to identify polyps. The                            terminal ileum, ileocecal valve, appendiceal                            orifice, and rectum were photographed. Scope In: 10:51:21 AM Scope Out: 11:23:55 AM Scope Withdrawal Time: 0 hours 29 minutes 4 seconds  Total Procedure Duration: 0 hours 32 minutes 34 seconds  Findings:                 A 12 mm polyp was found in the hepatic flexure,                            behind the fold. The polyp was sessile and                            difficult to access. The polyp was removed with a                            hot snare using a piecemeal technique. Resection                            and retrieval were complete. Area was tattooed with                            an injection  of 2 mL of Spot (carbon black).                           Three sessile polyps were found in the proximal                            descending colon, proximal ascending colon and mid                            ascending colon. The polyps were 5 to 6 mm in size.                            These polyps were removed with a cold snare.                            Resection and retrieval were complete.                           The colon (entire examined portion) appeared normal                            with well-preserved vascular pattern. Biopsies for                            histology were taken with a cold forceps from the                            entire colon for evaluation of microscopic colitis.                           Multiple medium-mouthed diverticula were found in                            the sigmoid colon.                           Non-bleeding internal  hemorrhoids were found during                            retroflexion. The hemorrhoids were small and Grade                            I (internal hemorrhoids that do not prolapse).                           The terminal ileum appeared normal.                           The perianal exam findings suggestive of a                            prolapsed polypoid lesion with superficial ulcers.  See photodocumentation. Few superficial biopsies                            were obtained. Complications:            No immediate complications. Estimated Blood Loss:     Estimated blood loss: none. Impression:               - One 12 mm polyp at the hepatic flexure, removed                            with a hot snare and removed with a cold snare.                            Resected and retrieved. Tattooed.                           - Three 5 to 6 mm polyps in the proximal descending                            colon, in the proximal ascending colon and in the                            mid ascending colon, removed with a cold snare.                            Resected and retrieved.                           - Moderate sigmoid diverticulosis.                           - Non-bleeding internal hemorrhoids.                           - The examined portion of the ileum was normal.                           - Perianal exam with prolapsed polypoid lesion                            (versus thrombosed hemorrhoids). Superficial                            biopsies obtained to rule out carcinoma Recommendation:           - Patient has a contact number available for                            emergencies. The signs and symptoms of potential                            delayed complications were discussed with the                            patient. Return to normal  activities tomorrow.                            Written discharge instructions were provided to the                             patient.                           - Resume previous diet.                           - Continue present medications.                           - No aspirin, ibuprofen, naproxen, or other                            non-steroidal anti-inflammatory drugs for 5 days                            after polyp removal.                           - Await pathology results.                           - Use HC Cream 2.5%: Apply externally BID for 10                            days.                           - If anorectal lesion needs to be evaluated                            further, would recommend surgical evaluation for                            examination under anesthesia                           - The findings and recommendations were discussed                            with the patient's family. Jackquline Denmark, MD 04/23/2022 11:36:28 AM This report has been signed electronically.

## 2022-04-23 NOTE — Op Note (Signed)
[facility] Patient Name: Christopher Francis Procedure Date: 04/23/2022 10:33 AM MRN: [id number] Endoscopist: Jackquline Denmark , MD, [id number] Age: 77 Referring MD:  Date of Birth: 1945-02-27 Gender: Male Account #: [id number] Procedure:                Upper GI endoscopy Indications:              Epigastric abdominal pain. GERD Medicines:                Monitored Anesthesia Care Procedure:                Pre-Anesthesia Assessment:                           - Prior to the procedure, a History and Physical                            was performed, and patient medications and                            allergies were reviewed. The patient's tolerance of                            previous anesthesia was also reviewed. The risks                            and benefits of the procedure and the sedation                            options and risks were discussed with the patient.                            All questions were answered, and informed consent                            was obtained. Prior Anticoagulants: The patient has                            taken no anticoagulant or antiplatelet agents. ASA                            Grade Assessment: II - A patient with mild systemic                            disease. After reviewing the risks and benefits,                            the patient was deemed in satisfactory condition to                            undergo the procedure.                           After obtaining informed consent, the endoscope was  passed under direct vision. Throughout the                            procedure, the patient's blood pressure, pulse, and                            oxygen saturations were monitored continuously. The                            Olympus Scope [id number] was introduced through the                            mouth, and advanced to the second part of duodenum.                            The upper GI endoscopy  was accomplished without                            difficulty. The patient tolerated the procedure                            well. Scope In: Scope Out: Findings:                 The lower third of the esophagus was mildly                            tortuous and foreshortened d/t HH.                           A 5 cm hiatal hernia was present extending from 33                            up to 38 cm                           Localized mild inflammation characterized by                            erythema was found in the gastric antrum. Biopsies                            were taken with a cold forceps for histology.                           A few 6 to 10 mm sessile polyps with no bleeding                            and no stigmata of recent bleeding were found in                            the gastric body. Three larger polyps were removed  with a hot snare. Resection and retrieval were                            complete.                           The examined duodenum was normal. Biopsies for                            histology were taken with a cold forceps for                            evaluation of celiac disease. Complications:            No immediate complications. Estimated Blood Loss:     Estimated blood loss: none. Impression:               - Large hiatal hernia.                           - Gastritis. Biopsied.                           - A few gastric polyps. Resected and retrieved x 3.                           - Normal examined duodenum. Biopsied. Recommendation:           - Patient has a contact number available for                            emergencies. The signs and symptoms of potential                            delayed complications were discussed with the                            patient. Return to normal activities tomorrow.                            Written discharge instructions were provided to the                             patient.                           - Resume previous diet.                           - Continue present medications including Protonix                            40 mg p.o. daily.                           - Await pathology results.                           -  No aspirin, ibuprofen, naproxen, or other                            non-steroidal anti-inflammatory drugs for 5 days                            after polyp removal.                           - The findings and recommendations were discussed                            with the patient's family. Jackquline Denmark, MD 04/23/2022 11:27:28 AM This report has been signed electronically.

## 2022-04-23 NOTE — Patient Instructions (Signed)
YOU HAD AN ENDOSCOPIC PROCEDURE TODAY AT [facility]:   Refer to the procedure report that was given to you for any specific questions about what was found during the examination.  If the procedure report does not answer your questions, please call your gastroenterologist to clarify.  If you requested that your care partner not be given the details of your procedure findings, then the procedure report has been included in a sealed envelope for you to review at your convenience later.  YOU SHOULD EXPECT: Some feelings of bloating in the abdomen. Passage of more gas than usual.  Walking can help get rid of the air that was put into your GI tract during the procedure and reduce the bloating. If you had a lower endoscopy (such as a colonoscopy or flexible sigmoidoscopy) you may notice spotting of blood in your stool or on the toilet paper. If you underwent a bowel prep for your procedure, you may not have a normal bowel movement for a few days.  Please Note:  You might notice some irritation and congestion in your nose or some drainage.  This is from the oxygen used during your procedure.  There is no need for concern and it should clear up in a day or so.  SYMPTOMS TO REPORT IMMEDIATELY:  Following lower endoscopy (colonoscopy or flexible sigmoidoscopy):  Excessive amounts of blood in the stool  Significant tenderness or worsening of abdominal pains  Swelling of the abdomen that is new, acute  Fever of 100F or higher  Following upper endoscopy (EGD)  Vomiting of blood or coffee ground material  New chest pain or pain under the shoulder blades  Painful or persistently difficult swallowing  New shortness of breath  Fever of 100F or higher  Black, tarry-looking stools  For urgent or emergent issues, a gastroenterologist can be reached at any hour by calling [phone]. Do not use MyChart messaging for urgent concerns.    DIET:  We do recommend a small meal at first, but  then you may proceed to your regular diet.  Drink plenty of fluids but you should avoid alcoholic beverages for 24 hours.  MEDICATIONS: Continue present medications including Protonix 40 mg by mouth daily. Use Anusol HC Cream 2.5%; Apply externally to rectum twice daily for 10 days. NO NON-STEROIDAL ANTI-INFLAMMATORY drugs such as Aspirin, Ibuprofen, Naproxen, etc. For 5 days after polyp removal.   FOLLOW UP: Await pathology results. If the anorectal lesion needs to be evaluated further, Dr. Lyndel Safe would recommend a surgical evaluation for examination under anesthesia.  HANDOUTS GIVEN TO PT: Gastritis, Hiatal Hernia, Polyps, Diverticulitis, Hemorrhoids.  Thank you for allowing [place] to provide for your healthcare needs today.  ACTIVITY:  You should plan to take it easy for the rest of today and you should NOT DRIVE or use heavy machinery until tomorrow (because of the sedation medicines used during the test).    FOLLOW UP: Our staff will call the number listed on your records the next business day following your procedure.  We will call around 7:15- 8:00 am to check on you and address any questions or concerns that you may have regarding the information given to you following your procedure. If we do not reach you, we will leave a message.     If any biopsies were taken you will be contacted by phone or by letter within the next 1-3 weeks.  Please call us at [phone] if you have not heard about the biopsies in  3 weeks.    SIGNATURES/CONFIDENTIALITY: You and/or your care partner have signed paperwork which will be entered into your electronic medical record.  These signatures attest to the fact that that the information above on your After Visit Summary has been reviewed and is understood.  Full responsibility of the confidentiality of this discharge information lies with you and/or your care-partner.

## 2022-04-24 ENCOUNTER — Telehealth: Payer: Self-pay | Admitting: *Deleted

## 2022-04-24 NOTE — Telephone Encounter (Signed)
  Follow up Call-     04/23/2022    9:54 AM  Call back number  Post procedure Call Back phone  # [phone]  Permission to leave phone message Yes     Patient questions:  Do you have a fever, pain , or abdominal swelling? No. Pain Score  0 *  Have you tolerated food without any problems? Yes.    Have you been able to return to your normal activities? Yes.    Do you have any questions about your discharge instructions: Diet   No. Medications  No. Follow up visit  No.  Do you have questions or concerns about your Care? No.  Actions: * If pain score is 4 or above: No action needed, pain <4.

## 2022-04-28 ENCOUNTER — Encounter: Payer: Self-pay | Admitting: Gastroenterology

## 2022-05-03 ENCOUNTER — Encounter: Payer: Self-pay | Admitting: Gastroenterology

## 2022-05-03 NOTE — Telephone Encounter (Signed)
Pt stated that he was not able to review the letter that was sent by Dr. Lyndel Safe in regard to his recent Path results. Letter was sent to pt via my chart.  Pt verbalized understanding with all questions answered.

## 2022-05-27 ENCOUNTER — Other Ambulatory Visit: Payer: Self-pay | Admitting: Cardiovascular Disease

## 2022-05-27 DIAGNOSIS — I10 Essential (primary) hypertension: Secondary | ICD-10-CM

## 2022-05-28 ENCOUNTER — Other Ambulatory Visit: Payer: Self-pay | Admitting: Cardiovascular Disease

## 2022-05-28 DIAGNOSIS — I10 Essential (primary) hypertension: Secondary | ICD-10-CM

## 2022-05-31 IMAGING — DX DG CHEST 2V
2 series · 2 of 2 positions shown · non-contrast
Comparison: 04/14/2021

CLINICAL DATA: Cough, recent pulmonary emboli

EXAM:
CHEST - 2 VIEW

[chest pa]
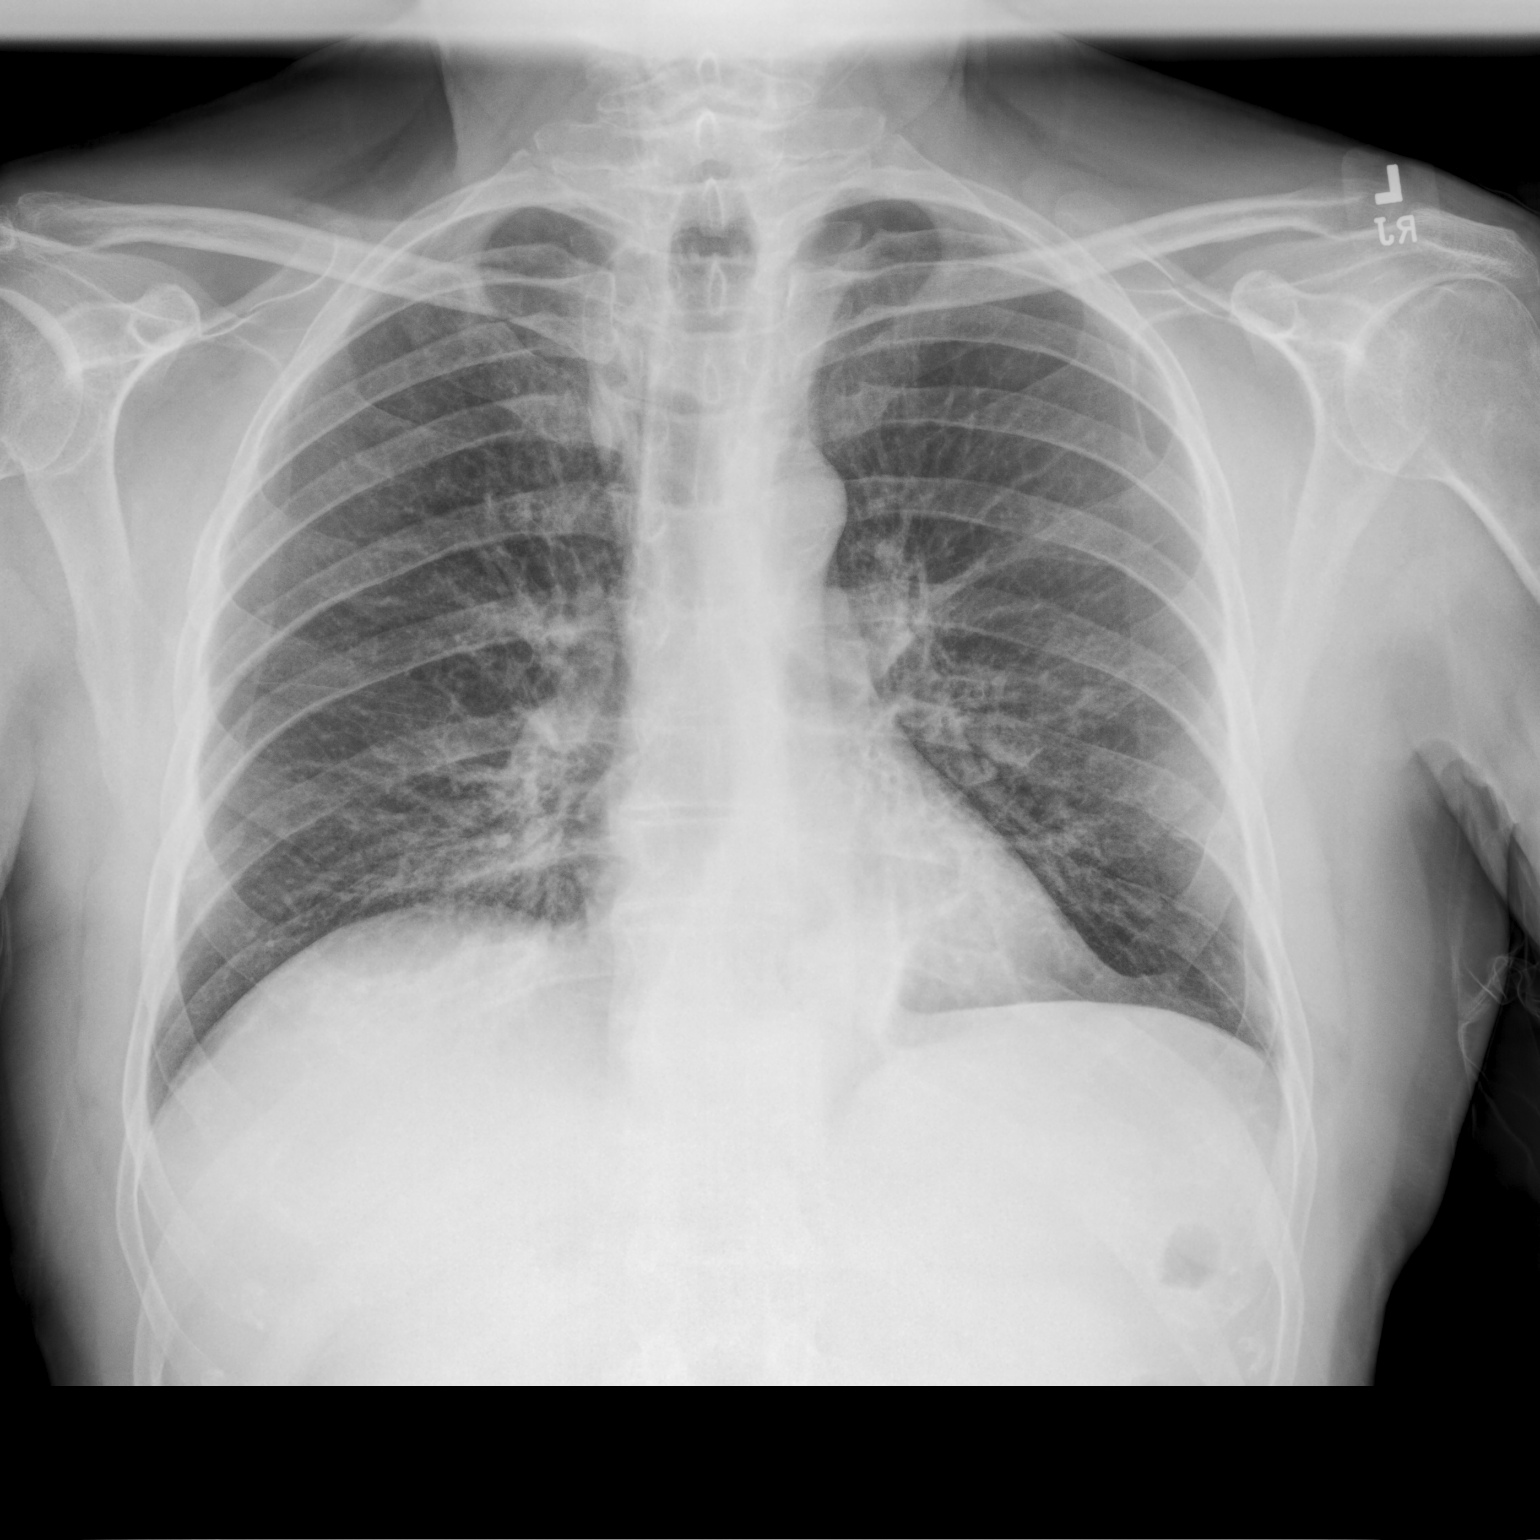

[chest lat]
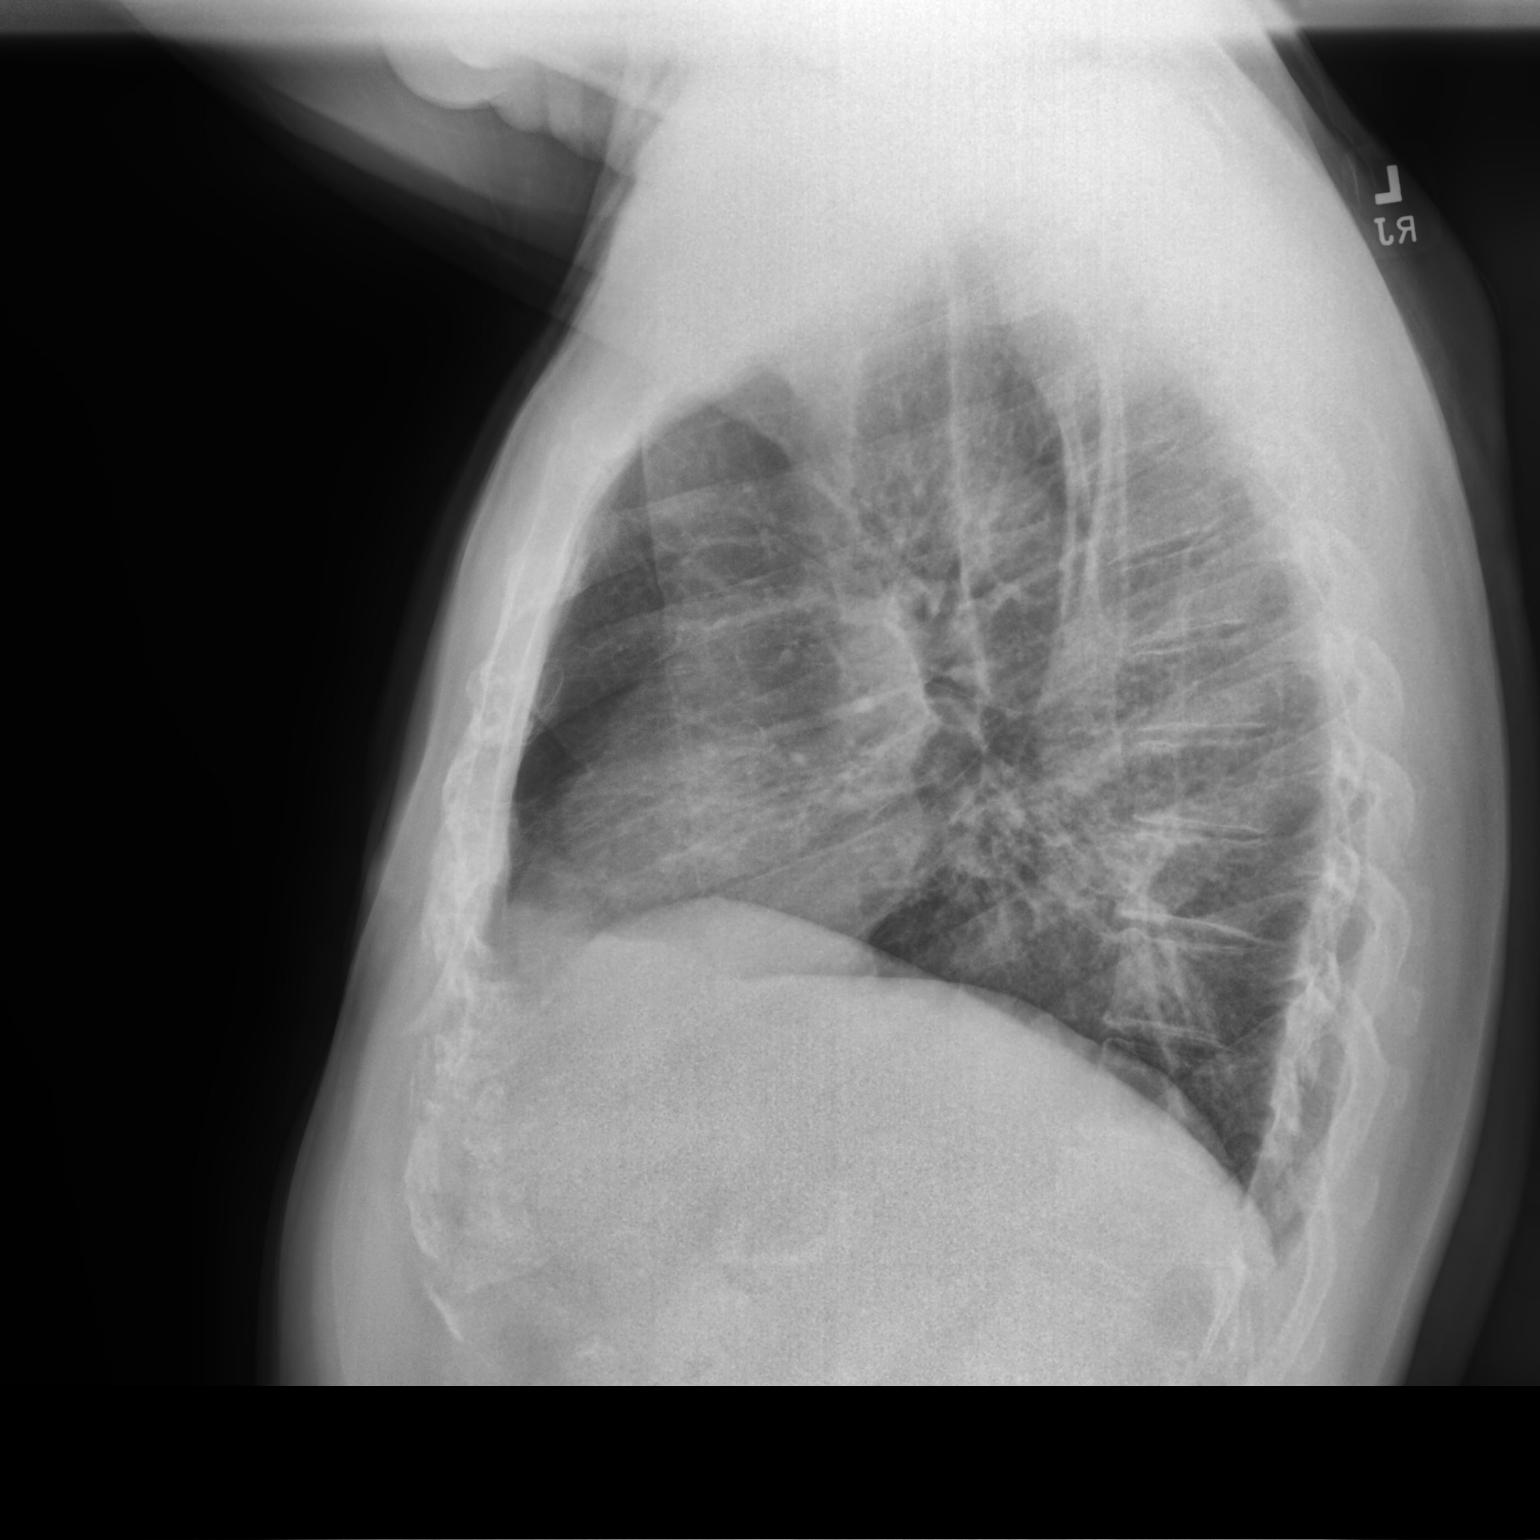

[2 of 2 positions shown; findings below may reference images not displayed]

FINDINGS: Frontal and lateral views of the chest demonstrate an unremarkable
cardiac silhouette. There has been significant improvement in the
bibasilar airspace disease seen previously. Minimal residual
bibasilar consolidation most pronounced within the left lower lobe.
No effusion or pneumothorax. No acute bony abnormalities.
IMPRESSION: 1. Significant improvement in the bibasilar airspace disease seen
previously, with mild residual consolidation as above. Findings
could reflect residual pneumonia or edema, though resolving
pulmonary infarcts could be considered given the history of
pulmonary emboli.

## 2022-06-22 ENCOUNTER — Other Ambulatory Visit: Payer: Self-pay | Admitting: Cardiovascular Disease

## 2022-06-22 DIAGNOSIS — I10 Essential (primary) hypertension: Secondary | ICD-10-CM

## 2022-07-11 ENCOUNTER — Encounter: Payer: Self-pay | Admitting: Cardiology

## 2022-07-11 ENCOUNTER — Ambulatory Visit: Payer: Medicare (Managed Care) | Attending: Physician Assistant | Admitting: Cardiology

## 2022-07-11 VITALS — BP 178/86 | HR 92 | Ht 68.0 in | Wt 182.0 lb

## 2022-07-11 DIAGNOSIS — I10 Essential (primary) hypertension: Secondary | ICD-10-CM

## 2022-07-11 DIAGNOSIS — I272 Pulmonary hypertension, unspecified: Secondary | ICD-10-CM

## 2022-07-11 DIAGNOSIS — Z139 Encounter for screening, unspecified: Secondary | ICD-10-CM

## 2022-07-11 DIAGNOSIS — Z13220 Encounter for screening for lipoid disorders: Secondary | ICD-10-CM

## 2022-07-11 MED ORDER — VALSARTAN 160 MG PO TABS: 160.0000 mg | ORAL_TABLET | Freq: Every day | ORAL | 1 refills | Status: DC

## 2022-07-11 NOTE — Progress Notes (Signed)
Cardiology Office Note:    Date:  07/11/2022   ID:  Christopher Francis, DOB Jan 01, 1946, MRN [id number]  PCP:  Marvis Repress, MD  [facility] Providers Cardiologist:  Tonny Bollman, MD    Referring MD: Marvis Repress, MD   Chief Complaint:  No chief complaint on file.    Patient Profile:  Recurrent syncope Normal cardiac workup, vasovagal Hypertension  Cardiac Studies & Procedures       ECHOCARDIOGRAM  ECHOCARDIOGRAM COMPLETE 07/06/2021  Narrative ECHOCARDIOGRAM REPORT    Patient Name:   Christopher Francis  Date of Exam: 07/06/2021 Medical Rec #:  [id number]     Height:       68.0 in Accession #:    [id number]    Weight:       183.4 lb Date of Birth:  May 29, 1945     BSA:          1.970 m Patient Age:    76 years      BP:           146/96 mmHg Patient Gender: M             HR:           88 bpm. Exam Location:  [address]  Procedure: 2D Echo, Cardiac Doppler and Color Doppler  Indications:    I27.20 Pulmonary hypertension  History:        Patient has prior history of Echocardiogram examinations, most recent 04/14/2021. Risk Factors:Hypertension.  Sonographer:    Sedonia Small Rodgers-Jones RDCS Referring Phys: [id number] MICHAEL COOPER  IMPRESSIONS   1. No evidence of pulmonary hypertension. 2. Left ventricular ejection fraction, by estimation, is 60 to 65%. Left ventricular ejection fraction by PLAX is 65 %. The left ventricle has normal function. The left ventricle has no regional wall motion abnormalities. There is mild concentric left ventricular hypertrophy. Left ventricular diastolic parameters are consistent with Grade I diastolic dysfunction (impaired relaxation). 3. Right ventricular systolic function is normal. The right ventricular size is normal. Tricuspid regurgitation signal is inadequate for assessing PA pressure. 4. The mitral valve is normal in structure. Trivial mitral valve regurgitation. No evidence of mitral stenosis. 5. The aortic valve is  tricuspid. Aortic valve regurgitation is not visualized. No aortic stenosis is present. 6. The inferior vena cava is normal in size with greater than 50% respiratory variability, suggesting right atrial pressure of 3 mmHg.  FINDINGS Left Ventricle: Left ventricular ejection fraction, by estimation, is 60 to 65%. Left ventricular ejection fraction by PLAX is 65 %. The left ventricle has normal function. The left ventricle has no regional wall motion abnormalities. The left ventricular internal cavity size was normal in size. There is mild concentric left ventricular hypertrophy. Left ventricular diastolic parameters are consistent with Grade I diastolic dysfunction (impaired relaxation). Normal left ventricular filling pressure.  Right Ventricle: The right ventricular size is normal. No increase in right ventricular wall thickness. Right ventricular systolic function is normal. Tricuspid regurgitation signal is inadequate for assessing PA pressure.  Left Atrium: Left atrial size was normal in size.  Right Atrium: Right atrial size was normal in size.  Pericardium: There is no evidence of pericardial effusion.  Mitral Valve: The mitral valve is normal in structure. Trivial mitral valve regurgitation. No evidence of mitral valve stenosis.  Tricuspid Valve: The tricuspid valve is normal in structure. Tricuspid valve regurgitation is trivial. No evidence of tricuspid stenosis.  Aortic Valve: The aortic valve is tricuspid. Aortic valve regurgitation is not visualized. No aortic stenosis  is present.  Pulmonic Valve: The pulmonic valve was normal in structure. Pulmonic valve regurgitation is trivial. No evidence of pulmonic stenosis.  Aorta: The aortic root is normal in size and structure.  Venous: The inferior vena cava is normal in size with greater than 50% respiratory variability, suggesting right atrial pressure of 3 mmHg.  IAS/Shunts: No atrial level shunt detected by color flow  Doppler.   LEFT VENTRICLE PLAX 2D LV EF:         Left            Diastology ventricular     LV e' medial:    8.05 cm/s ejection        LV E/e' medial:  8.5 fraction by     LV e' lateral:   9.14 cm/s PLAX is 65      LV E/e' lateral: 7.5 %. LVIDd:         3.20 cm LVIDs:         2.10 cm LV PW:         1.20 cm LV IVS:        1.20 cm LVOT diam:     2.00 cm LV SV:         53 LV SV Index:   27 LVOT Area:     3.14 cm   RIGHT VENTRICLE             IVC RV Basal diam:  3.90 cm     IVC diam: 1.50 cm RV S prime:     18.00 cm/s TAPSE (M-mode): 2.7 cm  LEFT ATRIUM             Index        RIGHT ATRIUM           Index LA diam:        4.20 cm 2.13 cm/m   RA Area:     13.10 cm LA Vol (A2C):   51.2 ml 25.99 ml/m  RA Volume:   32.50 ml  16.50 ml/m LA Vol (A4C):   41.9 ml 21.27 ml/m LA Biplane Vol: 46.5 ml 23.61 ml/m AORTIC VALVE LVOT Vmax:   89.13 cm/s LVOT Vmean:  58.567 cm/s LVOT VTI:    0.168 m  AORTA Ao Root diam: 3.40 cm Ao Asc diam:  3.50 cm  MITRAL VALVE MV Area (PHT): 3.75 cm    SHUNTS MV Decel Time: 203 msec    Systemic VTI:  0.17 m MV E velocity: 68.15 cm/s  Systemic Diam: 2.00 cm MV A velocity: 87.25 cm/s MV E/A ratio:  0.78  Chilton Si MD Electronically signed by Chilton Si MD Signature Date/Time: 07/06/2021/2:14:31 PM    Final    MONITORS  CARDIAC EVENT MONITOR 04/26/2015  Narrative 1. Sinus rhythm throughout 2. No arrhythmia or pathologic pauses 3. Benign Monitor result            History of Present Illness:   Christopher Francis is a 77 y.o. male with the above problem list.  He presents today for 1 year follow-up. Patient last seen by Dr. Excell Seltzer in May of 2023 for evaluation of recurrent syncope. Patient was hospitalized in February of last year prior to seeing [facility] after a syncopal episode at home. He required intubation and mechanical ventilation but was rapidly weaned/extubated. Of note, patient was found to have pulmonary emboli at the  time of syncope. He has a history of recurrent syncope, first occurring when he was in high school. Per patient, episodes always  occurred in the setting of abdominal pain and were never without warning. It was felt by Dr. Excell Seltzer that patient's syncope was vagally mediated. 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 He had repeat study after seeing Dr. Excell Seltzer that showed resolution of elevated pulmonary pressures.   Today patient has no acute concerns. He has been feeling well and working to increase exercise, now walks 30 min several days per week. No syncope since last year's episode that led to admission. BP elevated in clinic today but per patient, clinic BP has always been elevated and home readings are not as high. We discussed risk factors for hypertension including sleep apnea and salt in diet. Per patient, his wife has reported occasional snoring, no apneic episodes. He does not add salt to food at home and does not frequently eat canned foods but does eat out at least 3 times per week.   Today patient denies chest pain, shortness of breath, lower extremity edema, fatigue, palpitations, melena, hematuria, hemoptysis, diaphoresis, weakness, presyncope, syncope, orthopnea, and PND.    Past Medical History:  Diagnosis Date   Gastroesophageal reflux disease    Hemorrhoids    Hypertension    Current Medications: Current Meds  Medication Sig   amLODipine (NORVASC) 5 MG tablet Take 1 tablet (5 mg total) by mouth daily. Patient needs to keep appointment with provider for all future refills.   Cholecalciferol 1.25 MG (50000 UT) capsule Take 50,000 Units by mouth daily.   dicyclomine (BENTYL) 10 MG capsule Take 1 capsule (10 mg total) by mouth 2 (two) times daily.   hydrocortisone (ANUSOL-HC) 2.5 % rectal cream Place 1 Application rectally 2 (two) times daily. Apply externally twice daily for 10 days.   pantoprazole (PROTONIX) 40 MG  tablet Take 1 tablet (40 mg total) by mouth daily.   triamcinolone cream (KENALOG) 0.1 % Apply topically 2 (two) times daily as needed.   valsartan (DIOVAN) 160 MG tablet Take 1 tablet (160 mg total) by mouth daily.   [DISCONTINUED] losartan (COZAAR) 50 MG tablet Take 1 tablet (50 mg total) by mouth daily.   Current Facility-Administered Medications for the 07/11/22 encounter (Office Visit) with Perlie Gold, PA-C  Medication   0.9 %  sodium chloride infusion    Allergies:   Tape   Social History   Occupational History   Occupation: retired  Tobacco Use   Smoking status: Never   Smokeless tobacco: Never  Vaping Use   Vaping Use: Never used  Substance and Sexual Activity   Alcohol use: Yes    Comment: rare   Drug use: Never   Sexual activity: Yes    Family Hx: The patient's family history includes Heart disease in his father; Prostate cancer in his brother and father. There is no history of Colon cancer, Liver cancer, Stomach cancer, Pancreatic cancer, Esophageal cancer, or Rectal cancer.  Review of Systems  Constitutional: Negative for weight gain and weight loss.  Cardiovascular:  Negative for chest pain, dyspnea on exertion, irregular heartbeat, leg swelling, orthopnea, palpitations and syncope.  Respiratory:  Positive for snoring (occasional per patient's spouse). Negative for cough and shortness of breath.   Neurological:  Negative for dizziness, light-headedness and loss of balance.  All other systems reviewed and are negative.    EKGs/Labs/Other Test Reviewed:    EKG:  EKG is ordered today.  The ekg ordered today demonstrates sinus rhythm with single PVC. No acute ischemic changes. Stable with prior  ECG.    Recent Labs: No results found for requested labs within last 365 days.   Recent Lipid Panel No results for input(s): "CHOL", "TRIG", "HDL", "VLDL", "LDLCALC", "LDLDIRECT" in the last 8760 hours.    Risk Assessment/Calculations/Metrics:     STOP-Bang Score:   4      HYPERTENSION CONTROL Vitals:   07/11/22 0956 07/11/22 1042  BP: (!) 181/70 (!) 178/86    The patient's blood pressure is elevated above target today.  In order to address the patient's elevated BP: A new medication was prescribed today.; Follow up with general cardiology has been recommended.       Physical Exam:    VS:  BP (!) 178/86 (BP Location: Right Arm, Patient Position: Supine)   Pulse 92   Ht 5\' 8"  (1.727 m)   Wt 182 lb (82.6 kg)   SpO2 98%   BMI 27.67 kg/m     Wt Readings from Last 3 Encounters:  07/11/22 182 lb (82.6 kg)  04/23/22 183 lb (83 kg)  02/23/22 183 lb (83 kg)    Constitutional:      Appearance: Healthy appearance. Not in distress.  Neck:     Vascular: No JVR. JVD normal.  Pulmonary:     Effort: Pulmonary effort is normal.     Breath sounds: Normal breath sounds. No wheezing. No rhonchi. No rales.  Chest:     Chest wall: Not tender to palpatation.  Cardiovascular:     PMI at left midclavicular line. Normal rate. Regular rhythm. Normal S1. Normal S2.      Murmurs: There is no murmur.     No gallop.  No click. No rub.  Pulses:    Intact distal pulses.  Edema:    Peripheral edema absent.  Abdominal:     General: Bowel sounds are normal.     Palpations: Abdomen is soft.     Tenderness: There is no abdominal tenderness.  Musculoskeletal: Normal range of motion.        General: No tenderness. Skin:    General: Skin is warm and dry.  Neurological:     General: No focal deficit present.     Mental Status: Alert and oriented to person, place and time.         ASSESSMENT & PLAN:   Hypertension Patient with notable hypertension today in clinic, 181/70, 178/86. Per patient, home SBP typically 140s-150s. Shared decision with patient today to switch to Valsartan 160mg . Will continue Amlodipine 5mg . Patient reports occasional snoring. Epworth score 2, STOP-BANG 4. If BP remains elevated despite change in medication, would consider sleep study.  Patient educated on importance of low salt diet/limiting dining out to once per week (currently 3+). He will continue to monitor home BP and alert [place] if BP consistently above 130/80 goal. Will check BMP following medication change.   Encounter for medical screening examination Lipid panel and CBC last checked over a year ago. Will repeat fasting lipid panel with LDL goal <100 and CBC.            Dispo:  3-4 months  Medication Adjustments/Labs and Tests Ordered: Current medicines are reviewed at length with the patient today.  Concerns regarding medicines are outlined above.  Tests Ordered: Orders Placed This Encounter  Procedures   Lipid panel   Comp Met (CMET)   CBC   EKG 12-Lead   Medication Changes: Meds ordered this encounter  Medications   valsartan (DIOVAN) 160 MG tablet    Sig: Take 1  tablet (160 mg total) by mouth daily.    Dispense:  90 tablet    Refill:  1   Signed, Perlie Gold, PA-C  07/11/2022 10:51 AM    [facility] [address] [place], [place], [place]  [address] Phone: [phone]; Fax: [phone]

## 2022-07-11 NOTE — Assessment & Plan Note (Signed)
Lipid panel and CBC last checked over a year ago. Will repeat fasting lipid panel with LDL goal <100 and CBC.

## 2022-07-11 NOTE — Assessment & Plan Note (Addendum)
Patient with notable hypertension today in clinic, 181/70, 178/86. Per patient, home SBP typically 140s-150s. Shared decision with patient today to switch to Valsartan 160mg . Will continue Amlodipine 5mg . Patient reports occasional snoring. Epworth score 2, STOP-BANG 4. If BP remains elevated despite change in medication, would consider sleep study. Patient educated on importance of low salt diet/limiting dining out to once per week (currently 3+). He will continue to monitor home BP and alert [place] if BP consistently above 130/80 goal. Will check BMP following medication change.

## 2022-07-11 NOTE — Patient Instructions (Addendum)
Medication Instructions:  Your physician has recommended you make the following change in your medication:   STOP Losartan  START Valsartan 160 taking 1 daily    *If you need a refill on your cardiac medications before your next appointment, please call your pharmacy*   Lab Work: 07/18/22:  COME ANYTIME AFTER 7:15 FOR:  CMET, CBC, & FASTING LIPID  If you have labs (blood work) drawn today and your tests are completely normal, you will receive your results only by: MyChart Message (if you have MyChart) OR A paper copy in the mail If you have any lab test that is abnormal or we need to change your treatment, we will call you to review the results.   Testing/Procedures: None orderd   Follow-Up: At [facility], you and your health needs are our priority.  As part of our continuing mission to provide you with exceptional heart care, we have created designated Provider Care Teams.  These Care Teams include your primary Cardiologist (physician) and Advanced Practice Providers (APPs -  Physician Assistants and Nurse Practitioners) who all work together to provide you with the care you need, when you need it.  We recommend signing up for the patient portal called "MyChart".  Sign up information is provided on this After Visit Summary.  MyChart is used to connect with patients for Virtual Visits (Telemedicine).  Patients are able to view lab/test results, encounter notes, upcoming appointments, etc.  Non-urgent messages can be sent to your provider as well.   To learn more about what you can do with MyChart, go to [email or web address].    Your next appointment:   3 year(s)  Provider:   Tonny Bollman, MD     Other Instructions

## 2022-07-11 NOTE — Assessment & Plan Note (Deleted)
LDL last checked over a year ago. Will repeat fasting lipid panel with LDL goal <100.

## 2022-07-18 ENCOUNTER — Ambulatory Visit: Payer: Medicare (Managed Care) | Attending: Cardiology

## 2022-07-18 DIAGNOSIS — I10 Essential (primary) hypertension: Secondary | ICD-10-CM

## 2022-07-18 DIAGNOSIS — I272 Pulmonary hypertension, unspecified: Secondary | ICD-10-CM

## 2022-07-19 LAB — CBC
Hematocrit: 45.5 % (ref 37.5–51.0)
Hemoglobin: 14.5 g/dL (ref 13.0–17.7)
MCH: 26.9 pg (ref 26.6–33.0)
MCHC: 31.9 g/dL (ref 31.5–35.7)
MCV: 84 fL (ref 79–97)
Platelets: 235 10*3/uL (ref 150–450)
RBC: 5.4 x10E6/uL (ref 4.14–5.80)
RDW: 13.7 % (ref 11.6–15.4)
WBC: 5.5 10*3/uL (ref 3.4–10.8)

## 2022-07-19 LAB — COMPREHENSIVE METABOLIC PANEL
ALT: 15 IU/L (ref 0–44)
AST: 18 IU/L (ref 0–40)
Albumin/Globulin Ratio: 1.8 (ref 1.2–2.2)
Albumin: 4.4 g/dL (ref 3.8–4.8)
Alkaline Phosphatase: 81 IU/L (ref 44–121)
BUN/Creatinine Ratio: 11 (ref 10–24)
BUN: 13 mg/dL (ref 8–27)
Bilirubin Total: 0.3 mg/dL (ref 0.0–1.2)
CO2: 24 mmol/L (ref 20–29)
Calcium: 9.6 mg/dL (ref 8.6–10.2)
Chloride: 101 mmol/L (ref 96–106)
Creatinine, Ser: 1.22 mg/dL (ref 0.76–1.27)
Globulin, Total: 2.4 g/dL (ref 1.5–4.5)
Glucose: 105 mg/dL — ABNORMAL HIGH (ref 70–99)
Potassium: 4.3 mmol/L (ref 3.5–5.2)
Sodium: 138 mmol/L (ref 134–144)
Total Protein: 6.8 g/dL (ref 6.0–8.5)
eGFR: 61 mL/min/{1.73_m2} (ref >59)

## 2022-07-19 LAB — LIPID PANEL
Chol/HDL Ratio: 4.1 ratio (ref 0.0–5.0)
Cholesterol, Total: 232 mg/dL — ABNORMAL HIGH (ref 100–199)
HDL: 57 mg/dL (ref >39)
LDL Chol Calc (NIH): 156 mg/dL — ABNORMAL HIGH (ref 0–99)
Triglycerides: 108 mg/dL (ref 0–149)
VLDL Cholesterol Cal: 19 mg/dL (ref 5–40)

## 2022-09-14 ENCOUNTER — Other Ambulatory Visit: Payer: Self-pay | Admitting: Cardiovascular Disease

## 2022-09-14 DIAGNOSIS — I10 Essential (primary) hypertension: Secondary | ICD-10-CM

## 2022-10-11 ENCOUNTER — Ambulatory Visit: Payer: Medicare (Managed Care) | Attending: Cardiology | Admitting: Cardiology

## 2022-10-11 ENCOUNTER — Encounter: Payer: Self-pay | Admitting: Cardiology

## 2022-10-11 VITALS — BP 162/86 | HR 93 | Ht 68.0 in | Wt 187.0 lb

## 2022-10-11 DIAGNOSIS — E7800 Pure hypercholesterolemia, unspecified: Secondary | ICD-10-CM | POA: Diagnosis not present

## 2022-10-11 DIAGNOSIS — I10 Essential (primary) hypertension: Secondary | ICD-10-CM | POA: Diagnosis not present

## 2022-10-11 DIAGNOSIS — E785 Hyperlipidemia, unspecified: Secondary | ICD-10-CM | POA: Insufficient documentation

## 2022-10-11 MED ORDER — ATORVASTATIN CALCIUM 20 MG PO TABS: 20.0000 mg | ORAL_TABLET | Freq: Every day | ORAL | 3 refills | Status: DC

## 2022-10-11 MED ORDER — VALSARTAN 320 MG PO TABS: 320.0000 mg | ORAL_TABLET | Freq: Every day | ORAL | 3 refills | Status: DC

## 2022-10-11 NOTE — Patient Instructions (Signed)
Medication Instructions:  Your physician has recommended you make the following change in your medication:   Start atorvastatin 20 mg daily Increase Valsartan to 320 mg daily  *If you need a refill on your cardiac medications before your next appointment, please call your pharmacy*  Please monitor blood pressures and keep a log of your readings a couple of times and let me know what your readings are   Make sure to check 2 hours after your medications.    AVOID these things for 30 minutes before checking your blood pressure: No Drinking caffeine. No Drinking alcohol. No Eating. No Smoking. No Exercising.   Five minutes before checking your blood pressure: Pee. Sit in a dining chair. Avoid sitting in a soft couch or armchair. Be quiet. Do not talk     Lab Work: BMET 2 weeks  - if possible schedule on same day as Pharm D appointment  If you have labs (blood work) drawn today and your tests are completely normal, you will receive your results only by: MyChart Message (if you have MyChart) OR A paper copy in the mail If you have any lab test that is abnormal or we need to change your treatment, we will call you to review the results.   Follow-Up: At [facility], you and your health needs are our priority.  As part of our continuing mission to provide you with exceptional heart care, we have created designated Provider Care Teams.  These Care Teams include your primary Cardiologist (physician) and Advanced Practice Providers (APPs -  Physician Assistants and Nurse Practitioners) who all work together to provide you with the care you need, when you need it.   Your next appointment:   3 month(s)  Provider:   Perlie Gold

## 2022-10-11 NOTE — Assessment & Plan Note (Signed)
Patient with LDL 156 per May 2024 labs. He has previously tried and failed Crestor with myalgias. Will try Atorvastatin 20mg . If he also has side effects with this, would be a good candidate for PCSK9 consideration.

## 2022-10-11 NOTE — Assessment & Plan Note (Addendum)
Patient continues with hypertension, clinic BP 170s/70s-80s despite switch from Losartan to Valsartan last visit. Patient has been compliant with all medications and says that home BP is lower, typically 130s/80s. He expresses ongoing interest in limiting the number of prescriptions he needs but understands that BP is too high. Will increase Valsartan to 320mg  daily. Continue Amlodipine 5mg . Suspect that he will require a third anti-hypertension agent but will refer him to pharm D HTN clinic for follow up as well given preference to avoid extra prescription medication. I do also remain concerned that there may be a degree of underlying OSA and patient agreeable to a sleep study if blood pressure remains high. Repeat BMP in 2 weeks with medication change. Patient educated on importance of low salt diet.

## 2022-10-11 NOTE — Progress Notes (Signed)
Cardiology Office Note:   Date:  10/11/2022  ID:  Christopher Francis, DOB Jan 13, 1946, MRN [id number] PCP: Marvis Repress, MD  [facility] Providers Cardiologist:  Tonny Bollman, MD    History of Present Illness:   Christopher Francis is a 77 y.o. male with history of hypertension, recurrent syncope. He presents for follow up of blood pressure management today.   Patient seen by me in May of this year and was found to have elevated BP in clinic, 181/70, 178/86 mmHg. Shared decision made at that time to switch to Valsartan 160mg  from Losartan and continue Amlodipine 5mg . We also checked LDL which noted LDL of 156.   Today patient reports that he has been feeling well. He intermittently checks blood pressures at home and says that he typically sees readings of ~135 or more systolic, though not nearly as high as clinic readings today. He continues to express generally frustration with need for medication having not needing any prescriptions for most of his life. He wants to focus on weight loss and exercise.   Today patient denies chest pain, shortness of breath, lower extremity edema, fatigue, palpitations, melena, hematuria, hemoptysis, diaphoresis, weakness, presyncope, syncope, orthopnea, and PND.   Studies Reviewed:    EKG:          Risk Assessment/Calculations:     HYPERTENSION CONTROL Vitals:   10/11/22 1037 10/11/22 1112  BP: (!) 170/78 (!) 162/86    The patient's blood pressure is elevated above target today.  In order to address the patient's elevated BP: A current anti-hypertensive medication was adjusted today.      STOP-Bang Score:  4       Physical Exam:   VS:  BP (!) 162/86 (BP Location: Left Arm, Patient Position: Sitting)   Pulse 93   Ht 5\' 8"  (1.727 m)   Wt 187 lb (84.8 kg)   SpO2 96%   BMI 28.43 kg/m    Wt Readings from Last 3 Encounters:  10/11/22 187 lb (84.8 kg)  07/11/22 182 lb (82.6 kg)  04/23/22 183 lb (83 kg)     Physical Exam Vitals  reviewed.  Constitutional:      Appearance: Normal appearance.  HENT:     Head: Normocephalic.  Eyes:     Pupils: Pupils are equal, round, and reactive to light.  Cardiovascular:     Rate and Rhythm: Normal rate and regular rhythm.     Pulses: Normal pulses.     Heart sounds: Normal heart sounds.  Pulmonary:     Effort: Pulmonary effort is normal.     Breath sounds: Normal breath sounds.  Abdominal:     General: Abdomen is flat.     Palpations: Abdomen is soft.  Musculoskeletal:     Right lower leg: No edema.     Left lower leg: No edema.  Skin:    General: Skin is warm and dry.     Capillary Refill: Capillary refill takes less than 2 seconds.  Neurological:     General: No focal deficit present.     Mental Status: He is alert and oriented to person, place, and time.  Psychiatric:        Mood and Affect: Mood normal.        Behavior: Behavior normal.        Thought Content: Thought content normal.        Judgment: Judgment normal.      ASSESSMENT AND PLAN:   Hypertension Patient continues with hypertension, clinic BP  170s/70s-80s despite switch from Losartan to Valsartan last visit. Patient has been compliant with all medications and says that home BP is lower, typically 130s/80s. He expresses ongoing interest in limiting the number of prescriptions he needs but understands that BP is too high. Will increase Valsartan to 320mg  daily. Continue Amlodipine 5mg . Suspect that he will require a third anti-hypertension agent but will refer him to pharm D HTN clinic for follow up as well given preference to avoid extra prescription medication. I do also remain concerned that there may be a degree of underlying OSA and patient agreeable to a sleep study if blood pressure remains high. Repeat BMP in 2 weeks with medication change. Patient educated on importance of low salt diet.  Hyperlipidemia Patient with LDL 156 per May 2024 labs. He has previously tried and failed Crestor with  myalgias. Will try Atorvastatin 20mg . If he also has side effects with this, would be a good candidate for PCSK9 consideration.            Signed, Perlie Gold, PA-C

## 2022-10-12 ENCOUNTER — Encounter: Payer: Self-pay | Admitting: Cardiology

## 2022-10-25 ENCOUNTER — Ambulatory Visit: Payer: Medicare (Managed Care)

## 2022-10-25 ENCOUNTER — Ambulatory Visit: Payer: Medicare (Managed Care) | Attending: Cardiology | Admitting: Student

## 2022-10-25 ENCOUNTER — Encounter: Payer: Self-pay | Admitting: Student

## 2022-10-25 VITALS — BP 142/78 | HR 88 | Ht 68.0 in | Wt 178.0 lb

## 2022-10-25 DIAGNOSIS — I10 Essential (primary) hypertension: Secondary | ICD-10-CM

## 2022-10-25 LAB — BASIC METABOLIC PANEL
BUN/Creatinine Ratio: 17 (ref 10–24)
BUN: 22 mg/dL (ref 8–27)
CO2: 22 mmol/L (ref 20–29)
Calcium: 9.9 mg/dL (ref 8.6–10.2)
Chloride: 101 mmol/L (ref 96–106)
Creatinine, Ser: 1.26 mg/dL (ref 0.76–1.27)
Glucose: 109 mg/dL — ABNORMAL HIGH (ref 70–99)
Potassium: 4.6 mmol/L (ref 3.5–5.2)
Sodium: 138 mmol/L (ref 134–144)
eGFR: 59 mL/min/{1.73_m2} — ABNORMAL LOW (ref >59)

## 2022-10-25 NOTE — Patient Instructions (Addendum)
Changes made by your pharmacist Carmela Hurt, PharmD at today's visit:    Instructions/Changes  (what do you need to do) Your Notes  (what you did and when you did it)  Will wait for BMP result today if labs looks ok will add hydrochlorothiazide 12.5 mg daily to other BP medications    Reduce salt intake    Start doing exercise regularly. At least 150 minutes of moderate-intensity physical activity a week, such as 30 minutes a day, 5 days a week and 2 days of muscle-strengthening activity each week.    Bring all of your meds, your BP cuff and your record of home blood pressures to your next appointment.    HOW TO TAKE YOUR BLOOD PRESSURE AT HOME  Rest 5 minutes before taking your blood pressure.  Don't smoke or drink caffeinated beverages for at least 30 minutes before. Take your blood pressure before (not after) you eat. Sit comfortably with your back supported and both feet on the floor (don't cross your legs). Elevate your arm to heart level on a table or a desk. Use the proper sized cuff. It should fit smoothly and snugly around your bare upper arm. There should be enough room to slip a fingertip under the cuff. The bottom edge of the cuff should be 1 inch above the crease of the elbow. Ideally, take 3 measurements at one sitting and record the average.  Important lifestyle changes to control high blood pressure  Intervention  Effect on the BP  Lose extra pounds and watch your waistline Weight loss is one of the most effective lifestyle changes for controlling blood pressure. If you're overweight or obese, losing even a small amount of weight can help reduce blood pressure. Blood pressure might go down by about 1 millimeter of mercury (mm Hg) with each kilogram (about 2.2 pounds) of weight lost.  Exercise regularly As a general goal, aim for at least 30 minutes of moderate physical activity every day. Regular physical activity can lower high blood pressure by about 5 to 8 mm Hg.   Eat a healthy diet Eating a diet rich in whole grains, fruits, vegetables, and low-fat dairy products and low in saturated fat and cholesterol. A healthy diet can lower high blood pressure by up to 11 mm Hg.  Reduce salt (sodium) in your diet Even a small reduction of sodium in the diet can improve heart health and reduce high blood pressure by about 5 to 6 mm Hg.  Limit alcohol One drink equals 12 ounces of beer, 5 ounces of wine, or 1.5 ounces of 80-proof liquor.  Limiting alcohol to less than one drink a day for women or two drinks a day for men can help lower blood pressure by about 4 mm Hg.   If you have any questions or concerns please use My Chart to send questions or call the office at [phone]

## 2022-10-25 NOTE — Assessment & Plan Note (Addendum)
Assessment: BP is uncontrolled in office BP 142/78 mmHg (goal<130/80) Home BP ~ 136/72  Takes current BP medications regularly and tolerates them well without any side effects Denies SOB, palpitation, chest pain, headaches,or swelling Does not watch salt intake - reiterated the importance of regular exercise and low salt diet     Plan:  Drawn BMP today - post Diovan dose titration, if electrolytes  and renal function WNL will consider adding thiazide to current CCB and ARB  Continue taking amlodipine 5 mg daily, valsartan 320 mg daily  Patient to keep record of BP readings with heart rate and report to [place] at the next visit Bring home BP monitor for validation  Patient to see PharmD in 4 weeks for follow up

## 2022-10-25 NOTE — Progress Notes (Signed)
Patient ID: Christopher Francis                 DOB: May 12, 1945                      MRN: [id number]      HPI: Christopher Francis is a 77 y.o. male referred by Dr. Eustace Pen to HTN clinic. PMH is significant for hypertension ,HLD, recurrent syncope.  Patient's losartan was changed to valsartan 160 mg - May/22/24, at last visit 10/11/2022 with PA, BP was still elevated from valsartan dose was increased from 160 mg to 320 mg. Patient was not able to tolerate Crestor 20 or 10 mg so it was changed to Lipitor 20 mg daily.   Patient presented today for hypertension follow up. Reports he has been taking increased dose valsartan now for past 2 weeks. He started taking atorvastatin same time and he was having stomach pain/ upset stomach. So he has stopped taking  atorvastatin for past few days and stomach pain gone. He will be trying atorvastatin again tonight to see the s/e are  from statin. Brought in home BP log it ~ 136/ 72. Reports he takes his BP medications regularly and tolerates them well.   Current HTN meds: amlodipine 5 mg daily, valsartan 320 mg daily  Previously tried: losartan 100 mg  BP goal: <130/80   Family History:  Relation Problem Comments  Mother (Deceased)   Father (Deceased) Heart disease   Prostate cancer     Brother Metallurgist) Prostate cancer     Social History:  Alcohol: none  Smoking: none  Diet:  Breakfast: eggs toast, and cereals  Lunch: sandwiches - deli meat  Supper- chicken/ pork chops/hamburger with salads, corn  Drink: 1 soft drink - 5 days a week , water 1-2 glasses per day  Snacks: potato chips  Eats out 3 times week  Exercise:  started couple days ago -walking ~1 mile  - 30 min every day    Home BP readings:  SBP DBP  129 65  134 68  147 73  145 70  144 76  137 82  139 65  107 68  137 78  144 70  145 89  147 80  134  67  129 72  125 64  141 70  141 77  142 64  121 80  136.2105 72.52632    Wt Readings from Last 3 Encounters:  10/25/22 178  lb (80.7 kg)  10/11/22 187 lb (84.8 kg)  07/11/22 182 lb (82.6 kg)   BP Readings from Last 3 Encounters:  10/25/22 (!) 142/78  10/11/22 (!) 162/86  07/11/22 (!) 178/86   Pulse Readings from Last 3 Encounters:  10/25/22 88  10/11/22 93  07/11/22 92    Renal function: CrCl cannot be calculated (Patient's most recent lab result is older than the maximum 21 days allowed.).  Past Medical History:  Diagnosis Date   Gastroesophageal reflux disease    Hemorrhoids    Hypertension     Current Outpatient Medications on File Prior to Visit  Medication Sig Dispense Refill   amLODipine (NORVASC) 5 MG tablet TAKE 1 TABLET(5 MG) BY MOUTH DAILY 90 tablet 3   atorvastatin (LIPITOR) 20 MG tablet Take 1 tablet (20 mg total) by mouth daily. 90 tablet 3   Cholecalciferol 1.25 MG (50000 UT) capsule Take 50,000 Units by mouth daily.     hydrocortisone (ANUSOL-HC) 2.5 % rectal cream Place 1 Application rectally 2 (two) times  daily. Apply externally twice daily for 10 days. (Patient taking differently: Place 1 Application rectally as needed for hemorrhoids or anal itching. Apply externally twice daily for 10 days.) 30 g 1   pantoprazole (PROTONIX) 40 MG tablet Take 1 tablet (40 mg total) by mouth daily. 90 tablet 3   triamcinolone cream (KENALOG) 0.1 % Apply topically as needed (dry skin).     valsartan (DIOVAN) 320 MG tablet Take 1 tablet (320 mg total) by mouth daily. 90 tablet 3   Current Facility-Administered Medications on File Prior to Visit  Medication Dose Route Frequency Provider Last Rate Last Admin   0.9 %  sodium chloride infusion  500 mL Intravenous Once Lynann Bologna, MD        Allergies  Allergen Reactions   Tape Other (See Comments)    Adhesive tape caused blisters    Blood pressure (!) 142/78, pulse 88, height 5\' 8"  (1.727 m), weight 178 lb (80.7 kg).   Assessment/Plan:  1. Hypertension -  Hypertension Assessment: BP is uncontrolled in office BP 142/78 mmHg  (goal<130/80) Home BP ~ 136/72  Takes current BP medications regularly and tolerates them well without any side effects Denies SOB, palpitation, chest pain, headaches,or swelling Does not watch salt intake - reiterated the importance of regular exercise and low salt diet     Plan:  Drawn BMP today - post Diovan dose titration, if electrolytes  and renal function WNL will consider adding thiazide to current CCB and ARB  Continue taking amlodipine 5 mg daily, valsartan 320 mg daily  Patient to keep record of BP readings with heart rate and report to [place] at the next visit Bring home BP monitor for validation  Patient to see PharmD in 4 weeks for follow up      Thank you  Carmela Hurt, Pharm.D [facility] A Division of [facility] [address], [place], [place] [address]  Phone: [phone]; Fax: [phone]

## 2022-10-29 ENCOUNTER — Telehealth: Payer: Self-pay | Admitting: Pharmacist

## 2022-10-29 MED ORDER — HYDROCHLOROTHIAZIDE 25 MG PO TABS: 12.5000 mg | ORAL_TABLET | Freq: Every day | ORAL | 3 refills | Status: DC

## 2022-10-29 NOTE — Telephone Encounter (Signed)
Call to discuss BMP result - N/A LVM.

## 2022-10-30 NOTE — Telephone Encounter (Signed)
Called patient, reports he will be seeing Dr. Excell Seltzer on 11/22/2022. He states the nurse spoke to his step daughter old to hold on to changing any BP medication until he sees Dr.Copper.

## 2022-11-22 ENCOUNTER — Ambulatory Visit: Payer: Medicare (Managed Care) | Admitting: Cardiovascular Disease

## 2022-11-26 ENCOUNTER — Ambulatory Visit: Payer: Medicare (Managed Care)

## 2022-12-19 ENCOUNTER — Encounter: Payer: Self-pay | Admitting: Gastroenterology

## 2022-12-22 ENCOUNTER — Other Ambulatory Visit: Payer: Self-pay | Admitting: Cardiovascular Disease

## 2023-01-07 ENCOUNTER — Ambulatory Visit: Payer: Medicare (Managed Care) | Admitting: Cardiology

## 2023-02-27 ENCOUNTER — Ambulatory Visit: Payer: Medicare (Managed Care) | Admitting: Cardiovascular Disease

## 2023-03-01 ENCOUNTER — Ambulatory Visit: Payer: Medicare (Managed Care) | Attending: Cardiovascular Disease | Admitting: Cardiovascular Disease

## 2023-03-01 ENCOUNTER — Other Ambulatory Visit (HOSPITAL_COMMUNITY): Payer: Self-pay

## 2023-03-01 ENCOUNTER — Telehealth: Payer: Self-pay | Admitting: Pharmacy Technician

## 2023-03-01 ENCOUNTER — Telehealth: Payer: Self-pay | Admitting: Pharmacist

## 2023-03-01 ENCOUNTER — Encounter: Payer: Self-pay | Admitting: Cardiovascular Disease

## 2023-03-01 VITALS — BP 150/90 | HR 89 | Ht 68.0 in | Wt 181.4 lb

## 2023-03-01 DIAGNOSIS — R55 Syncope and collapse: Secondary | ICD-10-CM | POA: Diagnosis not present

## 2023-03-01 DIAGNOSIS — I10 Essential (primary) hypertension: Secondary | ICD-10-CM

## 2023-03-01 DIAGNOSIS — E7800 Pure hypercholesterolemia, unspecified: Secondary | ICD-10-CM | POA: Diagnosis not present

## 2023-03-01 NOTE — Progress Notes (Signed)
 Cardiology Office Note:    Date:  03/01/2023   ID:  Christopher Francis, DOB March 14, 1945, MRN [id number]  PCP:  Laurelyn Bars, MD   [facility] Providers Cardiologist:  Ozell Fell, MD     Referring MD: Laurelyn Bars, MD   Chief Complaint  Patient presents with   Hypertension    History of Present Illness:    Christopher Francis is a 78 y.o. male with a hx of recurrent syncope, presenting for follow-up evaluation.  Other problems include hypertension and history of pulmonary embolus.  He was followed closely last year by our APP team and also in the Pharm.D. clinic for management of his hypertension.  He required titration of his medications to improve blood pressure control.  The patient is here alone today.  He has been doing very well at home.  He is physically active doing a lot of work outdoors with no exertional symptoms.  He specifically denies chest pain, chest pressure, shortness of breath, or heart palpitations.  He has had no issues of lightheadedness or syncope over the past year.  He has been checking his home blood pressure and generally gets readings in the 130s over 70s at home.  He says he occasionally has a systolic reading greater than 859 mmHg and never has diastolic readings of 90 mmHg or greater.   Current Medications: Current Meds  Medication Sig   amLODipine  (NORVASC ) 5 MG tablet TAKE 1 TABLET(5 MG) BY MOUTH DAILY   atorvastatin  (LIPITOR) 20 MG tablet Take 1 tablet (20 mg total) by mouth daily. (Patient taking differently: Take 20 mg by mouth every other day.)   Cholecalciferol 1.25 MG (50000 UT) capsule Take 50,000 Units by mouth daily.   hydrocortisone  (ANUSOL -HC) 2.5 % rectal cream Place 1 Application rectally 2 (two) times daily. Apply externally twice daily for 10 days. (Patient taking differently: Place 1 Application rectally as needed for hemorrhoids or anal itching.)   pantoprazole  (PROTONIX ) 40 MG tablet Take 1 tablet (40 mg total) by mouth  daily.   triamcinolone cream (KENALOG) 0.1 % Apply topically as needed (dry skin).   valsartan  (DIOVAN ) 320 MG tablet Take 1 tablet (320 mg total) by mouth daily.   Current Facility-Administered Medications for the 03/01/23 encounter (Office Visit) with Fell Ozell, MD  Medication   0.9 %  sodium chloride  infusion     Allergies:   Tape   ROS:   Please see the history of present illness.    All other systems reviewed and are negative.  EKGs/Labs/Other Studies Reviewed:    The following studies were reviewed today: Cardiac Studies & Procedures      ECHOCARDIOGRAM  ECHOCARDIOGRAM COMPLETE 07/06/2021  Narrative ECHOCARDIOGRAM REPORT    Patient Name:   Christopher Francis  Date of Exam: 07/06/2021 Medical Rec #:  [id number]     Height:       68.0 in Accession #:    [id number]    Weight:       183.4 lb Date of Birth:  1945/07/01     BSA:          1.970 m Patient Age:    76 years      BP:           146/96 mmHg Patient Gender: M             HR:           88 bpm. Exam Location:  [address]  Procedure: 2D Echo, Cardiac Doppler and Color Doppler  Indications:    I27.20 Pulmonary hypertension  History:        Patient has prior history of Echocardiogram examinations, most recent 04/14/2021. Risk Factors:Hypertension.  Sonographer:    Carl Rodgers-Jones RDCS Referring Phys: [id number] Zaiyah Sottile  IMPRESSIONS   1. No evidence of pulmonary hypertension. 2. Left ventricular ejection fraction, by estimation, is 60 to 65%. Left ventricular ejection fraction by PLAX is 65 %. The left ventricle has normal function. The left ventricle has no regional wall motion abnormalities. There is mild concentric left ventricular hypertrophy. Left ventricular diastolic parameters are consistent with Grade I diastolic dysfunction (impaired relaxation). 3. Right ventricular systolic function is normal. The right ventricular size is normal. Tricuspid regurgitation signal is inadequate for assessing PA  pressure. 4. The mitral valve is normal in structure. Trivial mitral valve regurgitation. No evidence of mitral stenosis. 5. The aortic valve is tricuspid. Aortic valve regurgitation is not visualized. No aortic stenosis is present. 6. The inferior vena cava is normal in size with greater than 50% respiratory variability, suggesting right atrial pressure of 3 mmHg.  FINDINGS Left Ventricle: Left ventricular ejection fraction, by estimation, is 60 to 65%. Left ventricular ejection fraction by PLAX is 65 %. The left ventricle has normal function. The left ventricle has no regional wall motion abnormalities. The left ventricular internal cavity size was normal in size. There is mild concentric left ventricular hypertrophy. Left ventricular diastolic parameters are consistent with Grade I diastolic dysfunction (impaired relaxation). Normal left ventricular filling pressure.  Right Ventricle: The right ventricular size is normal. No increase in right ventricular wall thickness. Right ventricular systolic function is normal. Tricuspid regurgitation signal is inadequate for assessing PA pressure.  Left Atrium: Left atrial size was normal in size.  Right Atrium: Right atrial size was normal in size.  Pericardium: There is no evidence of pericardial effusion.  Mitral Valve: The mitral valve is normal in structure. Trivial mitral valve regurgitation. No evidence of mitral valve stenosis.  Tricuspid Valve: The tricuspid valve is normal in structure. Tricuspid valve regurgitation is trivial. No evidence of tricuspid stenosis.  Aortic Valve: The aortic valve is tricuspid. Aortic valve regurgitation is not visualized. No aortic stenosis is present.  Pulmonic Valve: The pulmonic valve was normal in structure. Pulmonic valve regurgitation is trivial. No evidence of pulmonic stenosis.  Aorta: The aortic root is normal in size and structure.  Venous: The inferior vena cava is normal in size with greater  than 50% respiratory variability, suggesting right atrial pressure of 3 mmHg.  IAS/Shunts: No atrial level shunt detected by color flow Doppler.   LEFT VENTRICLE PLAX 2D LV EF:         Left            Diastology ventricular     LV e' medial:    8.05 cm/s ejection        LV E/e' medial:  8.5 fraction by     LV e' lateral:   9.14 cm/s PLAX is 65      LV E/e' lateral: 7.5 %. LVIDd:         3.20 cm LVIDs:         2.10 cm LV PW:         1.20 cm LV IVS:        1.20 cm LVOT diam:     2.00 cm LV SV:         53 LV SV Index:   27 LVOT Area:  3.14 cm   RIGHT VENTRICLE             IVC RV Basal diam:  3.90 cm     IVC diam: 1.50 cm RV S prime:     18.00 cm/s TAPSE (M-mode): 2.7 cm  LEFT ATRIUM             Index        RIGHT ATRIUM           Index LA diam:        4.20 cm 2.13 cm/m   RA Area:     13.10 cm LA Vol (A2C):   51.2 ml 25.99 ml/m  RA Volume:   32.50 ml  16.50 ml/m LA Vol (A4C):   41.9 ml 21.27 ml/m LA Biplane Vol: 46.5 ml 23.61 ml/m AORTIC VALVE LVOT Vmax:   89.13 cm/s LVOT Vmean:  58.567 cm/s LVOT VTI:    0.168 m  AORTA Ao Root diam: 3.40 cm Ao Asc diam:  3.50 cm  MITRAL VALVE MV Area (PHT): 3.75 cm    SHUNTS MV Decel Time: 203 msec    Systemic VTI:  0.17 m MV E velocity: 68.15 cm/s  Systemic Diam: 2.00 cm MV A velocity: 87.25 cm/s MV E/A ratio:  0.78  Annabella Scarce MD Electronically signed by Annabella Scarce MD Signature Date/Time: 07/06/2021/2:14:31 PM    Final   MONITORS  CARDIAC EVENT MONITOR 03/29/2015  Narrative 1. Sinus rhythm throughout 2. No arrhythmia or pathologic pauses 3. Benign Monitor result           EKG:        Recent Labs: 07/18/2022: ALT 15; Hemoglobin 14.5; Platelets 235 10/25/2022: BUN 22; Creatinine, Ser 1.26; Potassium 4.6; Sodium 138  Recent Lipid Panel    Component Value Date/Time   CHOL 232 (H) 07/18/2022 0740   TRIG 108 07/18/2022 0740   HDL 57 07/18/2022 0740   CHOLHDL 4.1 07/18/2022 0740   CHOLHDL 6.7  04/15/2021 0156   VLDL 35 04/15/2021 0156   LDLCALC 156 (H) 07/18/2022 0740     Physical Exam:    VS:  BP (!) 150/90   Pulse 89   Ht 5' 8 (1.727 m)   Wt 181 lb 6.4 oz (82.3 kg)   SpO2 99%   BMI 27.58 kg/m     Wt Readings from Last 3 Encounters:  03/01/23 181 lb 6.4 oz (82.3 kg)  10/25/22 178 lb (80.7 kg)  10/11/22 187 lb (84.8 kg)     GEN:  Well nourished, well developed in no acute distress HEENT: Normal NECK: No JVD; No carotid bruits LYMPHATICS: No lymphadenopathy CARDIAC: RRR, no murmurs, rubs, gallops RESPIRATORY:  Clear to auscultation without rales, wheezing or rhonchi  ABDOMEN: Soft, non-tender, non-distended MUSCULOSKELETAL:  No edema; No deformity  SKIN: Warm and dry NEUROLOGIC:  Alert and oriented x 3 PSYCHIATRIC:  Normal affect   Assessment & Plan Essential hypertension Blood pressure is controlled based on home readings.  The patient will continue on a combination of amlodipine , hydrochlorothiazide , and valsartan .  We discussed blood pressure goals.  If he starts to see blood pressures exceeding 140/90 mmHg, he will reach out and I would increase his amlodipine  to 10 mg daily and/or increase hydrochlorothiazide  to 25 mg daily.  His most recent labs are reviewed with a creatinine of 1.26 and potassium of 4.6 in September 2024. Pure hypercholesterolemia Lipids have been above goal with LDL cholesterol 156 on last check.  He has not been compliant with atorvastatin   because of joint aches.  He takes it on occasion but is not taking it regularly.  I think he would do well with a PCSK9 inhibitor and then he could discontinue his statin drug altogether.  Will check on coverage with the Pharm.D. clinic and refer him for a lipid clinic appointment if he can obtain either Repatha  or Praluent. Syncope and collapse No recurrence.  Appears to be doing well at present.            Medication Adjustments/Labs and Tests Ordered: Current medicines are reviewed at length  with the patient today.  Concerns regarding medicines are outlined above.  No orders of the defined types were placed in this encounter.  No orders of the defined types were placed in this encounter.   Patient Instructions  Follow-Up: At [facility], you and your health needs are our priority.  As part of our continuing mission to provide you with exceptional heart care, we have created designated Provider Care Teams.  These Care Teams include your primary Cardiologist (physician) and Advanced Practice Providers (APPs -  Physician Assistants and Nurse Practitioners) who all work together to provide you with the care you need, when you need it.  Your next appointment:   1 year(s)  Provider:   Ozell Fell, MD      .[email or web address] Fus, Ozell Fell, MD  03/01/2023 9:47 AM    [facility]

## 2023-03-01 NOTE — Patient Instructions (Addendum)
 Follow-Up: At [facility], you and your health needs are our priority.  As part of our continuing mission to provide you with exceptional heart care, we have created designated Provider Care Teams.  These Care Teams include your primary Cardiologist (physician) and Advanced Practice Providers (APPs -  Physician Assistants and Nurse Practitioners) who all work together to provide you with the care you need, when you need it.  Your next appointment:   1 year(s)  Provider:   Ozell Fell, MD      .[email or web address]

## 2023-03-01 NOTE — Telephone Encounter (Signed)
-----   Message from Nurse Lauraine CROME sent at 03/01/2023  8:38 AM EST ----- Regarding: PCSK9i Patient seen in clinic today by Wonda and is not tolerating his statin well. Dr Wonda wanted to see if you guys have a way to check his plan to see if he's eligible for PCSK9i and if so, we'll place the referral. He just didn't want him to have to see you and take an appointment if not needed. Thanks, Lauraine

## 2023-03-01 NOTE — Telephone Encounter (Signed)
 Spoke with patient, made aware that Repatha has been approved. No further needs at this time

## 2023-03-01 NOTE — Telephone Encounter (Signed)
 Pharmacy Patient Advocate Encounter   Received notification from Pt Calls Messages that prior authorization for repatha  is required/requested.   Insurance verification completed.   The patient is insured through [employer] .   Per test claim: PA required; PA submitted to above mentioned insurance via CoverMyMeds Key/confirmation #/EOC [facility] Status is pending

## 2023-03-01 NOTE — Assessment & Plan Note (Signed)
 Lipids have been above goal with LDL cholesterol 156 on last check.  He has not been compliant with atorvastatin  because of joint aches.  He takes it on occasion but is not taking it regularly.  I think he would do well with a PCSK9 inhibitor and then he could discontinue his statin drug altogether.  Will check on coverage with the Pharm.D. clinic and refer him for a lipid clinic appointment if he can obtain either Repatha  or Praluent.

## 2023-03-01 NOTE — Telephone Encounter (Signed)
 Pharmacy Patient Advocate Encounter  Received notification from [employer] that Prior Authorization for repatha  has been APPROVED from 02/20/23 to 02/28/24. Ran test claim, Copay is $47.00 one month  . This test claim was processed through [facility]- copay amounts may vary at other pharmacies due to pharmacy/plan contracts, or as the patient moves through the different stages of their insurance plan.   PA #/Case ID/Reference #: [id number]

## 2023-03-06 ENCOUNTER — Encounter: Payer: Self-pay | Admitting: Cardiovascular Disease

## 2023-03-06 DIAGNOSIS — E7800 Pure hypercholesterolemia, unspecified: Secondary | ICD-10-CM

## 2023-03-07 MED ORDER — REPATHA SURECLICK 140 MG/ML ~~LOC~~ SOAJ
1.0000 mL | SUBCUTANEOUS | 1 refills | Status: DC
Start: 2023-03-07 — End: 2023-09-23

## 2023-03-09 ENCOUNTER — Other Ambulatory Visit: Payer: Self-pay | Admitting: Gastroenterology

## 2023-03-09 ENCOUNTER — Encounter: Payer: Self-pay | Admitting: Gastroenterology

## 2023-03-11 ENCOUNTER — Other Ambulatory Visit: Payer: Self-pay

## 2023-03-11 ENCOUNTER — Other Ambulatory Visit: Payer: Self-pay | Admitting: Gastroenterology

## 2023-03-11 DIAGNOSIS — K219 Gastro-esophageal reflux disease without esophagitis: Secondary | ICD-10-CM

## 2023-03-11 MED ORDER — PANTOPRAZOLE SODIUM 40 MG PO TBEC: 40.0000 mg | DELAYED_RELEASE_TABLET | Freq: Every day | ORAL | 0 refills | Status: DC

## 2023-03-11 NOTE — Telephone Encounter (Signed)
Pt stated that he is requesting a refill for the pantoprazole. Chart was reviewed. Pt was sent in a prescription for 1 month and scheduled for an office visit on 04/09/2023 with Doug Sou PA at 10:00 . Pt made aware. Pt verbalized understanding with all questions answered.

## 2023-03-20 ENCOUNTER — Other Ambulatory Visit: Payer: Self-pay | Admitting: Gastroenterology

## 2023-03-25 ENCOUNTER — Telehealth: Payer: Self-pay

## 2023-03-25 ENCOUNTER — Other Ambulatory Visit (HOSPITAL_COMMUNITY): Payer: Self-pay

## 2023-03-25 NOTE — Telephone Encounter (Signed)
Patient enrolled in [employer] and billing instructions faxed to pharmacy. Pt notified via mychart.

## 2023-03-25 NOTE — Telephone Encounter (Signed)
Patient Advocate Encounter   The patient was approved for a [employer] grant that will help cover the cost of REPATHA Total amount awarded, $10,000.  Effective: 02/23/23 - 02/22/24   [id number] [id number] [id number]   Pharmacy provided with approval and processing information. Patient informed via Dorcas Carrow, CPhT  Pharmacy Patient Advocate Specialist  Direct Number: [phone] Fax: [phone]

## 2023-04-09 ENCOUNTER — Ambulatory Visit: Payer: Medicare (Managed Care) | Admitting: Gastroenterology

## 2023-04-09 ENCOUNTER — Encounter: Payer: Self-pay | Admitting: Gastroenterology

## 2023-04-09 VITALS — BP 136/78 | HR 87 | Ht 68.0 in | Wt 183.0 lb

## 2023-04-09 DIAGNOSIS — K219 Gastro-esophageal reflux disease without esophagitis: Secondary | ICD-10-CM | POA: Diagnosis not present

## 2023-04-09 DIAGNOSIS — Z860100 Personal history of colon polyps, unspecified: Secondary | ICD-10-CM | POA: Diagnosis not present

## 2023-04-09 MED ORDER — SUFLAVE 178.7 G PO SOLR: 1.0000 | Freq: Once | ORAL | 0 refills | Status: AC

## 2023-04-09 NOTE — Patient Instructions (Signed)
You have been scheduled for a colonoscopy. Please follow written instructions given to you at your visit today.   If you use inhalers (even only as needed), please bring them with you on the day of your procedure.  DO NOT TAKE 7 DAYS PRIOR TO TEST- Trulicity (dulaglutide) Ozempic, Wegovy (semaglutide) Mounjaro (tirzepatide) Bydureon Bcise (exanatide extended release)  DO NOT TAKE 1 DAY PRIOR TO YOUR TEST Rybelsus (semaglutide) Adlyxin (lixisenatide) Victoza (liraglutide) Byetta (exanatide) ___________________________________________________________________________  Christopher Francis will receive your bowel preparation through [facility], which ensures the lowest copay and home delivery, with outreach via text or call from an [phone] number. Please respond promptly to avoid rescheduling of your procedure. If you are interested in alternative options or have any questions regarding your prep, please contact them at [phone] ____________________________________________________________________________  Your Provider Has Sent Your Bowel Prep Regimen To [facility]   [facility] will contact you to verify your information and collect your copay, if applicable. Enjoy the comfort of your home while your prescription is mailed to you, FREE of any shipping charges.   [facility] accepts all major insurance benefits and applies discounts & coupons.  Have additional questions?   Chat: [email or web address] Call: [phone] Email: [email or web address] NCPDP: [id number]  How will [facility] contact you?  With a Welcome phone call,  a Welcome text and a checkout link in text form.  Texts you receive from [phone] Are NOT Spam.  *To set up delivery, you must complete the checkout process via link or speak to one of the patient care representatives. If [facility] is unable to reach you, your prescription may be delayed.  To avoid long hold times on the phone, you may also utilize the secure chat  feature on the [facility] website to request that they call you back for transaction completion or to expedite your concerns.  _______________________________________________________  If your blood pressure at your visit was 140/90 or greater, please contact your primary care physician to follow up on this.  _______________________________________________________  If you are age 77 or older, your body mass index should be between 23-30. Your Body mass index is 27.83 kg/m. If this is out of the aforementioned range listed, please consider follow up with your Primary Care Provider.  If you are age 50 or younger, your body mass index should be between 19-25. Your Body mass index is 27.83 kg/m. If this is out of the aformentioned range listed, please consider follow up with your Primary Care Provider.   ________________________________________________________  The  GI providers would like to encourage you to use [facility] to communicate with providers for non-urgent requests or questions.  Due to long hold times on the telephone, sending your provider a message by [facility] may be a faster and more efficient way to get a response.  Please allow 48 business hours for a response.  Please remember that this is for non-urgent requests.  _______________________________________________________

## 2023-04-09 NOTE — Progress Notes (Signed)
04/09/2023 Christopher Francis [id number] 1946/02/18   HISTORY OF PRESENT ILLNESS:  This is a 78 year old male who is a patient of Dr. Urban Gibson.  He is here today to get refills on her pantoprazole for his GERD and to discuss and schedule colonoscopy.  Colonoscopy 04/2022:  - One 12 mm polyp at the hepatic flexure, removed with a hot snare and removed with a cold snare. Resected and retrieved. Tattooed. - Three 5 to 6 mm polyps in the proximal descending colon, in the proximal ascending colon and in the mid ascending colon, removed with a cold snare. Resected and retrieved. - Moderate sigmoid diverticulosis. - Non- bleeding internal hemorrhoids. - The examined portion of the ileum was normal. - Perianal exam with prolapsed polypoid lesion ( versus thrombosed hemorrhoids) . Superficial biopsies obtained to rule out carcinoma.  4. Surgical [P], random colon - COLONIC MUCOSA WITHIN NORMAL LIMITS. 5. 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 - FRAGMENTS OF SESSILE SERRATED POLYP (GIVEN THE MULTIPLICITY OF POLYPS AND FRAGMENTED NATURE OF THE SPECIMEN, IT IS NOT MORPHOLOGICALLY POSSIBLE TO DISTINGUISH SEPARATE SESSILE SERRATED POLYPS AND TUBULAR ADENOMAS FROM POSSIBLE SESSILE SERRATED POLYPS WITH DYSPLASIA). 6. Surgical [P], colon, hepatic flexure, polyp (1) - TUBULAR ADENOMA, FRAGMENTS. 7. Surgical [P], colon, intra rectal lesion - SOLITARY RECTAL ULCER/PROLAPSE TYPE POLYP.  Repeat was recommended in 1 year.  In regards to his reflux, his symptoms are overall well controlled on pantoprazole 40 mg daily for the most part.  Tends to have issues at night/in the evening if he eats certain foods or eats too late.   Sees bright red blood with bowel movements from his hemorrhoids, uses anusol if needed but says that they are not painful so he does not use it often and just deals with it.  Takes metamucil daily, which he thinks  helps with emptying his stools better, having a BM 2-3 times daily.   Past Medical History:  Diagnosis Date   Gastroesophageal reflux disease    Hemorrhoids    Hypertension    Past Surgical History:  Procedure Laterality Date   APPENDECTOMY  02/20/2012   COLONOSCOPY     COLONOSCOPY WITH ESOPHAGOGASTRODUODENOSCOPY (EGD)  04/23/2022    reports that he has never smoked. He has never used smokeless tobacco. He reports current alcohol use. He reports that he does not use drugs. family history includes Heart disease in his father; Prostate cancer in his brother and father. Allergies  Allergen Reactions   Tape Other (See Comments)    Adhesive tape caused blisters      Outpatient Encounter Medications as of 04/09/2023  Medication Sig   amLODipine (NORVASC) 5 MG tablet TAKE 1 TABLET(5 MG) BY MOUTH DAILY   Cholecalciferol 1.25 MG (50000 UT) capsule Take 50,000 Units by mouth daily.   Evolocumab (REPATHA SURECLICK) 140 MG/ML SOAJ Inject 140 mg into the skin every 14 (fourteen) days.   hydrocortisone (ANUSOL-HC) 2.5 % rectal cream Place 1 Application rectally 2 (two) times daily. Apply externally twice daily for 10 days. (Patient taking differently: Place 1 Application rectally as needed for hemorrhoids or anal itching.)   pantoprazole (PROTONIX) 40 MG tablet TAKE 1 TABLET(40 MG) BY MOUTH DAILY   triamcinolone cream (KENALOG) 0.1 % Apply topically as needed (dry skin).   valsartan (DIOVAN) 320 MG tablet Take 1 tablet (320 mg total) by mouth daily.   [DISCONTINUED] atorvastatin (LIPITOR) 20 MG tablet Take 1 tablet (20 mg total) by  mouth daily. (Patient taking differently: Take 20 mg by mouth every other day.)   [DISCONTINUED] hydrochlorothiazide (HYDRODIURIL) 25 MG tablet Take 0.5 tablets (12.5 mg total) by mouth daily.   [DISCONTINUED] pantoprazole (PROTONIX) 40 MG tablet TAKE 1 TABLET(40 MG) BY MOUTH DAILY   Facility-Administered Encounter Medications as of 04/09/2023  Medication   0.9 %   sodium chloride infusion    REVIEW OF SYSTEMS  : All other systems reviewed and negative except where noted in the History of Present Illness.   PHYSICAL EXAM: BP 136/78   Pulse 87   Ht 5\' 8"  (1.727 m)   Wt 183 lb (83 kg)   BMI 27.83 kg/m  General: Well developed white male in no acute distress Head: Normocephalic and atraumatic Eyes:  Sclerae anicteric, conjunctiva pink. Ears: Normal auditory acuity Lungs: Clear throughout to auscultation; no W/R/R. Heart: Regular rate and rhythm; no W/R/R. Rectal:  Will be done at the time of colonoscopy. Musculoskeletal: Symmetrical with no gross deformities  Skin: No lesions on visible extremities Extremities: No edema  Neurological: Alert oriented x 4, grossly non-focal Psychological:  Alert and cooperative. Normal mood and affect  ASSESSMENT AND PLAN: *GERD:  Well controlled on pantoprazole 40 mg daily for the most part.  Tends to have issues at night/in the evening if he eats certain foods or eats too late.  May benefit from changing PPI to the evening, before dinner.  Will refill when needed. *Personal history of colon polyps:  Had a large polyp removed by piecemeal that pathologist was unable to characterize from colonoscopy 04/2022 so repeat was recommended in one year.  Will schedule with Dr. Chales Abrahams.  The risks, benefits, and alternatives to colonoscopy were discussed with the patient and he consents to proceed.   CC:  Marvis Repress, MD

## 2023-05-27 ENCOUNTER — Other Ambulatory Visit: Payer: Self-pay | Admitting: Cardiovascular Disease

## 2023-05-27 DIAGNOSIS — E7800 Pure hypercholesterolemia, unspecified: Secondary | ICD-10-CM

## 2023-05-28 ENCOUNTER — Telehealth: Payer: Self-pay | Admitting: Cardiovascular Disease

## 2023-05-28 DIAGNOSIS — Z87898 Personal history of other specified conditions: Secondary | ICD-10-CM

## 2023-05-28 NOTE — Telephone Encounter (Signed)
 Order placed for PSA at this time. Lipid order still active from January. Called and spoke to patient to make him aware.

## 2023-05-28 NOTE — Telephone Encounter (Signed)
  Per MyChart scheduling message:  At my visit with Dr Excell Seltzer recently I believe he said he wanted me to have another lipid panel done after taking repatha for 3 months.Also, I would like to have a psa test done as well. Can orders be placed?

## 2023-06-03 ENCOUNTER — Encounter: Payer: Self-pay | Admitting: Gastroenterology

## 2023-06-03 LAB — PSA: Prostate Specific Ag, Serum: 1.9 ng/mL (ref 0.0–4.0)

## 2023-06-04 ENCOUNTER — Other Ambulatory Visit: Payer: Self-pay | Admitting: Gastroenterology

## 2023-06-10 ENCOUNTER — Encounter: Payer: Medicare (Managed Care) | Admitting: Gastroenterology

## 2023-06-11 ENCOUNTER — Ambulatory Visit: Payer: Medicare (Managed Care) | Admitting: Gastroenterology

## 2023-06-11 ENCOUNTER — Encounter: Payer: Self-pay | Admitting: Gastroenterology

## 2023-06-11 VITALS — BP 122/80 | HR 78 | Temp 98.2°F | Resp 12 | Ht 68.0 in | Wt 183.0 lb

## 2023-06-11 DIAGNOSIS — K641 Second degree hemorrhoids: Secondary | ICD-10-CM

## 2023-06-11 DIAGNOSIS — K635 Polyp of colon: Secondary | ICD-10-CM | POA: Diagnosis not present

## 2023-06-11 DIAGNOSIS — K5730 Diverticulosis of large intestine without perforation or abscess without bleeding: Secondary | ICD-10-CM

## 2023-06-11 DIAGNOSIS — D128 Benign neoplasm of rectum: Secondary | ICD-10-CM | POA: Diagnosis not present

## 2023-06-11 DIAGNOSIS — K620 Anal polyp: Secondary | ICD-10-CM | POA: Diagnosis not present

## 2023-06-11 DIAGNOSIS — Z860100 Personal history of colon polyps, unspecified: Secondary | ICD-10-CM

## 2023-06-11 DIAGNOSIS — K5140 Inflammatory polyps of colon without complications: Secondary | ICD-10-CM

## 2023-06-11 DIAGNOSIS — Z1211 Encounter for screening for malignant neoplasm of colon: Secondary | ICD-10-CM

## 2023-06-11 DIAGNOSIS — D123 Benign neoplasm of transverse colon: Secondary | ICD-10-CM

## 2023-06-11 MED ORDER — SODIUM CHLORIDE 0.9 % IV SOLN: 500.0000 mL | Freq: Once | INTRAVENOUS | Status: DC

## 2023-06-11 NOTE — Progress Notes (Signed)
 Called to room to assist during endoscopic procedure.  Patient ID and intended procedure confirmed with present staff. Received instructions for my participation in the procedure from the performing physician.

## 2023-06-11 NOTE — Progress Notes (Signed)
 Report to PACU, RN, vss, BBS= Clear.

## 2023-06-11 NOTE — Progress Notes (Signed)
 [facility] History and Physical   Primary Care Physician:  Alida Ion, MD   Reason for Procedure:   H/O advanced polyps 04/2022  Plan:    colon     HPI: Christopher Francis is a 78 y.o. male    Past Medical History:  Diagnosis Date   Gastroesophageal reflux disease    Hemorrhoids    Hyperlipidemia    Hypertension     Past Surgical History:  Procedure Laterality Date   APPENDECTOMY  02/20/2012   COLONOSCOPY     COLONOSCOPY WITH ESOPHAGOGASTRODUODENOSCOPY (EGD)  04/23/2022    Prior to Admission medications   Medication Sig Start Date End Date Taking? Authorizing Provider  amLODipine  (NORVASC ) 5 MG tablet TAKE 1 TABLET(5 MG) BY MOUTH DAILY 09/14/22  Yes Arnoldo Lapping, MD  Cholecalciferol 1.25 MG (50000 UT) capsule Take 50,000 Units by mouth daily. 01/19/20  Yes [provider]  pantoprazole  (PROTONIX ) 40 MG tablet TAKE 1 TABLET(40 MG) BY MOUTH DAILY 06/04/23  Yes Lajuan Pila, MD  psyllium (METAMUCIL) 58.6 % powder Take 1 packet by mouth daily.   Yes [provider]  valsartan  (DIOVAN ) 320 MG tablet Take 1 tablet (320 mg total) by mouth daily. 10/11/22  Yes Leala Prince, PA-C  Evolocumab  (REPATHA  SURECLICK) 140 MG/ML SOAJ Inject 140 mg into the skin every 14 (fourteen) days. 03/07/23   Arnoldo Lapping, MD  hydrocortisone  (ANUSOL -HC) 2.5 % rectal cream Place 1 Application rectally 2 (two) times daily. Apply externally twice daily for 10 days. Patient taking differently: Place 1 Application rectally as needed for hemorrhoids or anal itching. 04/23/22   Lajuan Pila, MD  triamcinolone cream (KENALOG) 0.1 % Apply topically as needed (dry skin).    [provider]    Current Outpatient Medications  Medication Sig Dispense Refill   amLODipine  (NORVASC ) 5 MG tablet TAKE 1 TABLET(5 MG) BY MOUTH DAILY 90 tablet 3   Cholecalciferol 1.25 MG (50000 UT) capsule Take 50,000 Units by mouth daily.     pantoprazole  (PROTONIX ) 40 MG tablet TAKE 1  TABLET(40 MG) BY MOUTH DAILY 90 tablet 0   psyllium (METAMUCIL) 58.6 % powder Take 1 packet by mouth daily.     valsartan  (DIOVAN ) 320 MG tablet Take 1 tablet (320 mg total) by mouth daily. 90 tablet 3   Evolocumab  (REPATHA  SURECLICK) 140 MG/ML SOAJ Inject 140 mg into the skin every 14 (fourteen) days. 6 mL 1   hydrocortisone  (ANUSOL -HC) 2.5 % rectal cream Place 1 Application rectally 2 (two) times daily. Apply externally twice daily for 10 days. (Patient taking differently: Place 1 Application rectally as needed for hemorrhoids or anal itching.) 30 g 1   triamcinolone cream (KENALOG) 0.1 % Apply topically as needed (dry skin).     Current Facility-Administered Medications  Medication Dose Route Frequency Provider Last Rate Last Admin   0.9 %  sodium chloride  infusion  500 mL Intravenous Once Lajuan Pila, MD       0.9 %  sodium chloride  infusion  500 mL Intravenous Once Lajuan Pila, MD        Allergies as of 06/11/2023 - Review Complete 06/11/2023  Allergen Reaction Noted   Tape Other (See Comments) 11/05/2012    Family History  Problem Relation Age of Onset   Heart disease Father    Prostate cancer Father    Prostate cancer Brother    Colon cancer Neg Hx    Liver cancer Neg Hx    Stomach cancer Neg Hx    Pancreatic cancer Neg Hx  Esophageal cancer Neg Hx    Rectal cancer Neg Hx     Social History   Socioeconomic History   Marital status: Married    Spouse name: Not on file   Number of children: 2   Years of education: Not on file   Highest education level: Not on file  Occupational History   Occupation: retired  Tobacco Use   Smoking status: Never   Smokeless tobacco: Never  Vaping Use   Vaping status: Never Used  Substance and Sexual Activity   Alcohol use: Yes    Comment: rare   Drug use: Never   Sexual activity: Yes  Other Topics Concern   Not on file  Social History Narrative   The patient is retired. He is married. He does not smoke cigarettes or  drink alcohol.   Social Drivers of Corporate investment banker Strain: Low Risk  (08/31/2020)   Received from [facility], [facility]   Overall Financial Resource Strain (CARDIA)    Difficulty of Paying Living Expenses: Not hard at all  Food Insecurity: No Food Insecurity (04/19/2021)   Received from [facility], [facility]   Hunger Vital Sign    Worried About Running Out of Food in the Last Year: Never true    Ran Out of Food in the Last Year: Never true  Transportation Needs: No Transportation Needs (08/31/2020)   Received from [facility], [facility]   PRAPARE - Transportation    Lack of Transportation (Medical): No    Lack of Transportation (Non-Medical): No  Physical Activity: Insufficiently Active (08/31/2020)   Received from [facility], [facility]   Exercise Vital Sign    Days of Exercise per Week: 2 days    Minutes of Exercise per Session: 30 min  Stress: No Stress Concern Present (08/31/2020)   Received from [facility] - Occupational Stress Questionnaire    Feeling of Stress : Only a little  Social Connections: Socially Integrated (01/17/2022)   Received from [facility], [facility]   Social Network    How would you rate your social network (family, work, friends)?: Good participation with social networks  Intimate Partner Violence: Not At Risk (01/17/2022)   Received from [facility], [facility]   HITS    Over the last 12 months how often did your partner physically hurt you?: Never    Over the last 12 months how often did your partner insult you or talk down to you?: Never    Over the last 12 months how often did your partner threaten you with physical harm?: Never    Over the last 12 months how often did your partner scream or curse at you?: Never    Review of Systems: Positive for none All other review of systems negative except as mentioned in the HPI.  Physical Exam: Vital  signs in last 24 hours: @VSRANGES @   General:   Alert,  Well-developed, well-nourished, pleasant and cooperative in NAD Lungs:  Clear throughout to auscultation.   Heart:  Regular rate and rhythm; no murmurs, clicks, rubs,  or gallops. Abdomen:  Soft, nontender and nondistended. Normal bowel sounds.   Neuro/Psych:  Alert and cooperative. Normal mood and affect. A and O x 3    No significant changes were identified.  The patient continues to be an appropriate candidate for the planned procedure and anesthesia.   Magnus Schuller, MD. [facility] 06/11/2023 11:08 AM@

## 2023-06-11 NOTE — Patient Instructions (Signed)
 Please read handouts provided. Continue present medications. Await pathology results. Resume previous diet. Repeat colonoscopy for screening based on pathology results.  YOU HAD AN ENDOSCOPIC PROCEDURE TODAY AT THE [facility]:   Refer to the procedure report that was given to you for any specific questions about what was found during the examination.  If the procedure report does not answer your questions, please call your gastroenterologist to clarify.  If you requested that your care partner not be given the details of your procedure findings, then the procedure report has been included in a sealed envelope for you to review at your convenience later.  YOU SHOULD EXPECT: Some feelings of bloating in the abdomen. Passage of more gas than usual.  Walking can help get rid of the air that was put into your GI tract during the procedure and reduce the bloating. If you had a lower endoscopy (such as a colonoscopy or flexible sigmoidoscopy) you may notice spotting of blood in your stool or on the toilet paper. If you underwent a bowel prep for your procedure, you may not have a normal bowel movement for a few days.  Please Note:  You might notice some irritation and congestion in your nose or some drainage.  This is from the oxygen used during your procedure.  There is no need for concern and it should clear up in a day or so.  SYMPTOMS TO REPORT IMMEDIATELY:  Following lower endoscopy (colonoscopy or flexible sigmoidoscopy):  Excessive amounts of blood in the stool  Significant tenderness or worsening of abdominal pains  Swelling of the abdomen that is new, acute  Fever of 100F or higher.  For urgent or emergent issues, a gastroenterologist can be reached at any hour by calling [phone]. Do not use MyChart messaging for urgent concerns.    DIET:  We do recommend a small meal at first, but then you may proceed to your regular diet.  Drink plenty of fluids but you should avoid  alcoholic beverages for 24 hours.  ACTIVITY:  You should plan to take it easy for the rest of today and you should NOT DRIVE or use heavy machinery until tomorrow (because of the sedation medicines used during the test).    FOLLOW UP: Our staff will call the number listed on your records the next business day following your procedure.  We will call around 7:15- 8:00 am to check on you and address any questions or concerns that you may have regarding the information given to you following your procedure. If we do not reach you, we will leave a message.     If any biopsies were taken you will be contacted by phone or by letter within the next 1-3 weeks.  Please call us  at [phone] if you have not heard about the biopsies in 3 weeks.    SIGNATURES/CONFIDENTIALITY: You and/or your care partner have signed paperwork which will be entered into your electronic medical record.  These signatures attest to the fact that that the information above on your After Visit Summary has been reviewed and is understood.  Full responsibility of the confidentiality of this discharge information lies with you and/or your care-partner.

## 2023-06-11 NOTE — Op Note (Signed)
 [facility] Patient Name: Christopher Francis Procedure Date: 06/11/2023 10:58 AM MRN: [id number] Endoscopist: Lajuan Pila , MD, [id number] Age: 78 Referring MD:  Date of Birth: 01-06-1946 Gender: Male Account #: [id number] Procedure:                Colonoscopy Indications:              High risk colon cancer surveillance: Personal                            history of advanced colonic polyps 04/2022 Medicines:                Monitored Anesthesia Care Procedure:                Pre-Anesthesia Assessment:                           - Prior to the procedure, a History and Physical                            was performed, and patient medications and                            allergies were reviewed. The patient's tolerance of                            previous anesthesia was also reviewed. The risks                            and benefits of the procedure and the sedation                            options and risks were discussed with the patient.                            All questions were answered, and informed consent                            was obtained. Prior Anticoagulants: The patient has                            taken no anticoagulant or antiplatelet agents. ASA                            Grade Assessment: II - A patient with mild systemic                            disease. After reviewing the risks and benefits,                            the patient was deemed in satisfactory condition to                            undergo the procedure.  After obtaining informed consent, the colonoscope                            was passed under direct vision. Throughout the                            procedure, the patient's blood pressure, pulse, and                            oxygen saturations were monitored continuously. The                            Olympus Scope SN: [id number] was introduced through                            the anus and advanced to  the the cecum, identified                            by appendiceal orifice and ileocecal valve. The                            colonoscopy was performed without difficulty. The                            patient tolerated the procedure well. The quality                            of the bowel preparation was good. The ileocecal                            valve, appendiceal orifice, and rectum were                            photographed. Scope In: 11:11:11 AM Scope Out: 11:28:49 AM Scope Withdrawal Time: 0 hours 14 minutes 13 seconds  Total Procedure Duration: 0 hours 17 minutes 38 seconds  Findings:                 Two sessile polyps were found in the mid rectum and                            mid transverse colon. The polyps were 3 to 5 mm in                            size. These polyps were removed with a cold snare.                            Resection and retrieval were complete.                           A tattoo was seen at the hepatic flexure. The                            tattoo site  appeared normal. This was examined both                            on straight view and retroflexed exam. The area was                            also examined by NBI. Several passes were made. No                            residual polyp was noted.                           Multiple medium-mouthed diverticula were found in                            the sigmoid colon.                           Non-bleeding internal hemorrhoids were found during                            retroflexion and during perianal exam. The                            hemorrhoids were moderate and Grade II (internal                            hemorrhoids that prolapse but reduce                            spontaneously). Anorectal lesion was noted on the                            rectal exam - With superficial ulcers, similar to                            previous exam 04/2022. Biopsies were taken with a                             cold forceps for histology. Estimated blood loss                            was minimal.                           The exam was otherwise without abnormality on                            direct and retroflexion views. Complications:            No immediate complications. Estimated Blood Loss:     Estimated blood loss: none. Impression:               - Two 3 to 5 mm polyps in the mid rectum and in the  mid transverse colon, removed with a cold snare.                            Resected and retrieved.                           - A tattoo was seen at the hepatic flexure. The                            tattoo site appeared normal.                           - Diverticulosis in the sigmoid colon.                           - Non-bleeding internal hemorrhoids.                           - Anorectal lesion- Biopsied (similar appearance as                            previous colonoscopy 04/2022).                           - The examination was otherwise normal on direct                            and retroflexion views. Recommendation:           - Patient has a contact number available for                            emergencies. The signs and symptoms of potential                            delayed complications were discussed with the                            patient. Return to normal activities tomorrow.                            Written discharge instructions were provided to the                            patient.                           - Resume previous diet.                           - Continue present medications.                           - Await pathology results.                           - Repeat colonoscopy for surveillance based on  pathology results. Likely not needed d/t age.                           - If with any anorectal problems, would have low                            threshold in referring him to surgery for  EUA/                            hemorrhoidectomy.                           - The findings and recommendations were discussed                            with the patient's family. Lajuan Pila, MD 06/11/2023 11:41:50 AM This report has been signed electronically.

## 2023-06-12 ENCOUNTER — Telehealth: Payer: Self-pay

## 2023-06-12 NOTE — Telephone Encounter (Signed)
  Follow up Call-     06/11/2023   10:36 AM 04/23/2022    9:54 AM  Call back number  Post procedure Call Back phone  # [phone]  Permission to leave phone message Yes Yes     Patient questions:  Do you have a fever, pain , or abdominal swelling? No. Pain Score  0 *  Have you tolerated food without any problems? Yes.    Have you been able to return to your normal activities? Yes.    Do you have any questions about your discharge instructions: Diet   No. Medications  No. Follow up visit  No.  Do you have questions or concerns about your Care? No.  Actions: * If pain score is 4 or above: No action needed, pain <4.

## 2023-06-13 ENCOUNTER — Telehealth: Payer: Self-pay | Admitting: Cardiovascular Disease

## 2023-06-13 LAB — SURGICAL PATHOLOGY

## 2023-06-13 NOTE — Telephone Encounter (Signed)
 Spoke to patient.He wanted to know if lipid panel was done when he had psa on 4/14.Advised lipid panel was not done.Advised order has been released.He will have done.

## 2023-06-13 NOTE — Telephone Encounter (Signed)
  Patient needs the lipid order to be released so he can get his labs done.

## 2023-06-16 ENCOUNTER — Encounter: Payer: Self-pay | Admitting: Gastroenterology

## 2023-07-19 ENCOUNTER — Other Ambulatory Visit: Payer: Self-pay

## 2023-07-23 NOTE — Progress Notes (Unsigned)
 Office Note 07/24/2023  CC:  Chief Complaint  Patient presents with   Annual Exam    Pt is fasting   HPI:  Christopher Francis is a 78 y.o. male who is here to establish care, CPE. Patient's most recent primary MD: [facility] Old records in epic/health Link EMR were reviewed prior to or during today's visit.  Feeling well other than chronic L knee pain, osteoarthritis.  Followed by ortho with [facility], has appt later today for steroid inj, says the injections used to work very well but the last 1 3 mo ago did not. He is set to [employer] 2-day course show tomorrow and wants to be able to walk around. 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  He is fairly active but does not do much formal exercise.  He was put on Repatha  by Dr. Arlester Ladd about 5 months ago. Blood pressure monitoring at home is consistently in the 130s over 70s.  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   History of DVT (deep vein thrombosis) 03/2021   Bilateral posterior tibial, peroneal, and soleal veins   History of pulmonary embolism 03/2021   History of syncope    Hyperlipidemia    Hypertension     Past Surgical History:  Procedure Laterality Date   APPENDECTOMY  02/20/2012   BREAST SURGERY  1969   Removed fatty tissue   COLONOSCOPY     COLONOSCOPY WITH ESOPHAGOGASTRODUODENOSCOPY (EGD)  04/23/2022   FRACTURE SURGERY  1959   Broken leg    Family History  Problem Relation Age of Onset   Hearing loss Mother    Miscarriages / [place] Mother    Vision loss Mother    Heart disease Father    Prostate cancer Father    Prostate cancer Brother    Diabetes  Brother    High Cholesterol Brother    High blood pressure Brother    High blood pressure Son    Colon cancer Neg Hx    Liver cancer Neg Hx    Stomach cancer Neg Hx    Pancreatic cancer Neg Hx    Esophageal cancer Neg Hx    Rectal cancer Neg Hx     Social History   Socioeconomic History   Marital status: Married    Spouse name: Not on file   Number of children: 2   Years of education: Not on file   Highest education level: 12th grade  Occupational History   Occupation: retired  Tobacco Use   Smoking status: Never   Smokeless tobacco: Never  Vaping Use   Vaping status: Never Used  Substance and Sexual Activity   Alcohol use: Yes    Comment: rare   Drug use: Never   Sexual activity: Yes  Other Topics Concern   Not on file  Social History Narrative   The patient is retired. He is married. He does not smoke cigarettes or drink alcohol.   Educ: HS   Social Drivers of Corporate investment banker Strain: Low Risk  (07/23/2023)   Overall Financial Resource Strain (CARDIA)    Difficulty of  Paying Living Expenses: Not hard at all  Food Insecurity: No Food Insecurity (07/23/2023)   Hunger Vital Sign    Worried About Running Out of Food in the Last Year: Never true    Ran Out of Food in the Last Year: Never true  Transportation Needs: No Transportation Needs (07/23/2023)   PRAPARE - Administrator, Civil Service (Medical): No    Lack of Transportation (Non-Medical): No  Physical Activity: Insufficiently Active (07/23/2023)   Exercise Vital Sign    Days of Exercise per Week: 2 days    Minutes of Exercise per Session: 30 min  Stress: No Stress Concern Present (07/23/2023)   [employer] - Occupational Stress Questionnaire    Feeling of Stress : Not at all  Social Connections: Socially Integrated (07/23/2023)   Social Connection and Isolation Panel [NHANES]    Frequency of Communication with Friends and Family: More than three times a week     Frequency of Social Gatherings with Friends and Family: More than three times a week    Attends Religious Services: More than 4 times per year    Active Member of [employer] or Organizations: Yes    Attends Banker Meetings: 1 to 4 times per year    Marital Status: Married  Catering manager Violence: Not At Risk (01/17/2022)   [facility]   HITS    Over the last 12 months how often did your partner physically hurt you?: Never    Over the last 12 months how often did your partner insult you or talk down to you?: Never    Over the last 12 months how often did your partner threaten you with physical harm?: Never    Over the last 12 months how often did your partner scream or curse at you?: Never    Outpatient Encounter Medications as of 07/24/2023  Medication Sig   amLODipine  (NORVASC ) 5 MG tablet TAKE 1 TABLET(5 MG) BY MOUTH DAILY   Evolocumab  (REPATHA  SURECLICK) 140 MG/ML SOAJ Inject 140 mg into the skin every 14 (fourteen) days.   hydrochlorothiazide  (HYDRODIURIL ) 12.5 MG tablet Take 12.5 mg by mouth daily.   hydrocortisone  (ANUSOL -HC) 2.5 % rectal cream Place 1 Application rectally 2 (two) times daily. Apply externally twice daily for 10 days. (Patient taking differently: Place 1 Application rectally as needed for hemorrhoids or anal itching.)   pantoprazole  (PROTONIX ) 40 MG tablet TAKE 1 TABLET(40 MG) BY MOUTH DAILY   valsartan  (DIOVAN ) 320 MG tablet Take 1 tablet (320 mg total) by mouth daily.   No facility-administered encounter medications on file as of 07/24/2023.    Allergies  Allergen Reactions   Tape Other (See Comments)    Adhesive tape caused blisters    Review of Systems  Constitutional:  Negative for appetite change, chills, fatigue and fever.  HENT:  Negative for congestion, dental problem, ear pain and sore throat.   Eyes:  Negative for discharge, redness and visual disturbance.  Respiratory:  Negative for cough, chest tightness,  shortness of breath and wheezing.   Cardiovascular:  Negative for chest pain, palpitations and leg swelling.  Gastrointestinal:  Negative for abdominal pain, blood in stool, diarrhea, nausea and vomiting.  Genitourinary:  Negative for difficulty urinating, dysuria, flank pain, frequency, hematuria and urgency.  Musculoskeletal:  Positive for arthralgias (left knee). Negative for back pain, joint swelling, myalgias and neck stiffness.  Skin:  Negative for pallor and rash.  Neurological:  Negative for  dizziness, speech difficulty, weakness and headaches.  Hematological:  Negative for adenopathy. Does not bruise/bleed easily.  Psychiatric/Behavioral:  Negative for confusion and sleep disturbance. The patient is not nervous/anxious.     PE; Blood pressure (!) 158/80, pulse 96, temperature 98 F (36.7 C), temperature source Oral, height 5' 8.75" (1.746 m), weight 178 lb 9.6 oz (81 kg), SpO2 100%. Body mass index is 26.57 kg/m.  Physical Exam  Gen: Alert, well appearing.  Patient is oriented to person, place, time, and situation. AFFECT: pleasant, lucid thought and speech. ENT: Ears: EACs clear, normal epithelium.  TMs with good light reflex and landmarks bilaterally.  Eyes: no injection, icteris, swelling, or exudate.  EOMI, PERRLA. Nose: no drainage or turbinate edema/swelling.  No injection or focal lesion.  Mouth: lips without lesion/swelling.  Oral mucosa pink and moist.  Dentition intact and without obvious caries or gingival swelling.  Oropharynx without erythema, exudate, or swelling.  Neck: supple/nontender.  No LAD, mass, or TM.  Carotid pulses 2+ bilaterally, without bruits. CV: RRR, no m/r/g.   LUNGS: CTA bilat, nonlabored resps, good aeration in all lung fields. ABD: soft, NT, ND, BS normal.  No hepatospenomegaly or mass.  No bruits. EXT: no clubbing, cyanosis, or edema.  Musculoskeletal: no joint swelling, erythema, warmth, or tenderness.  ROM of all joints intact. Skin - no  sores or suspicious lesions or rashes or color changes  Pertinent labs:  Last CBC Lab Results  Component Value Date   WBC 5.5 07/18/2022   HGB 14.5 07/18/2022   HCT 45.5 07/18/2022   MCV 84 07/18/2022   MCH 26.9 07/18/2022   RDW 13.7 07/18/2022   PLT 235 07/18/2022   Last metabolic panel Lab Results  Component Value Date   GLUCOSE 109 (H) 10/25/2022   NA 138 10/25/2022   K 4.6 10/25/2022   CL 101 10/25/2022   CO2 22 10/25/2022   BUN 22 10/25/2022   CREATININE 1.26 10/25/2022   EGFR 59 (L) 10/25/2022   CALCIUM  9.9 10/25/2022   PHOS 4.0 04/15/2021   PROT 6.8 07/18/2022   ALBUMIN 4.4 07/18/2022   LABGLOB 2.4 07/18/2022   AGRATIO 1.8 07/18/2022   BILITOT 0.3 07/18/2022   ALKPHOS 81 07/18/2022   AST 18 07/18/2022   ALT 15 07/18/2022   ANIONGAP 12 04/15/2021   Last lipids Lab Results  Component Value Date   CHOL 232 (H) 07/18/2022   HDL 57 07/18/2022   LDLCALC 156 (H) 07/18/2022   TRIG 108 07/18/2022   CHOLHDL 4.1 07/18/2022   Last hemoglobin A1c Lab Results  Component Value Date   HGBA1C 6.4 (H) 04/11/2021   Last thyroid functions Lab Results  Component Value Date   TSH 2.98 05/30/2021   Lab Results  Component Value Date   PSA1 1.9 06/03/2023   ASSESSMENT AND PLAN:   #1 health maintenance exam: Reviewed age and gender appropriate health maintenance issues (prudent diet, regular exercise, health risks of tobacco and excessive alcohol, use of seatbelts, fire alarms in home, use of sunscreen).  Also reviewed age and gender appropriate health screening as well as vaccine recommendations. Vaccines: Prevnar 20-->declined.  Shingrix-->declined.   Labs: CBC with differential, c-Met, lipid panel, hemoglobin A1c (prediabetes) Prostate ca screening: PSA 06/03/2023 was 1.9. Colon ca screening: Most recent colonoscopy 06/11/2023, polyps noted.  GI said no further colonoscopy needed.  #2 hypertension, well-controlled on valsartan  320 mg a day, HCTZ 12.5 mg a day, and  amlodipine  5 mg a day. Electrolytes and creatinine monitoring today.  3.  Hypercholesterolemia.  He has been on Repatha  injections for the last 5 months. Monitor lipid panel and hepatic panel today.  Will be sure to make sure Dr. Arlester Ladd is aware of the results.  #4 prediabetes. Monitor hemoglobin A1c and fasting glucose today.  An After Visit Summary was printed and given to the patient.  Return in about 6 months (around 01/23/2024) for routine chronic illness f/u.  Signed:  Arletha Lady, MD           07/24/2023

## 2023-07-24 ENCOUNTER — Encounter: Payer: Self-pay | Admitting: Family Medicine

## 2023-07-24 ENCOUNTER — Ambulatory Visit (INDEPENDENT_AMBULATORY_CARE_PROVIDER_SITE_OTHER): Payer: Medicare (Managed Care) | Admitting: Family Medicine

## 2023-07-24 VITALS — BP 158/80 | HR 96 | Temp 98.0°F | Ht 68.75 in | Wt 178.6 lb

## 2023-07-24 DIAGNOSIS — R7303 Prediabetes: Secondary | ICD-10-CM

## 2023-07-24 DIAGNOSIS — I10 Essential (primary) hypertension: Secondary | ICD-10-CM

## 2023-07-24 DIAGNOSIS — Z0000 Encounter for general adult medical examination without abnormal findings: Secondary | ICD-10-CM | POA: Diagnosis not present

## 2023-07-24 DIAGNOSIS — E7800 Pure hypercholesterolemia, unspecified: Secondary | ICD-10-CM

## 2023-07-24 LAB — COMPREHENSIVE METABOLIC PANEL WITH GFR
ALT: 17 U/L (ref 0–53)
AST: 17 U/L (ref 0–37)
Albumin: 4.7 g/dL (ref 3.5–5.2)
Alkaline Phosphatase: 63 U/L (ref 39–117)
BUN: 18 mg/dL (ref 6–23)
CO2: 27 meq/L (ref 19–32)
Calcium: 10.4 mg/dL (ref 8.4–10.5)
Chloride: 100 meq/L (ref 96–112)
Creatinine, Ser: 1.3 mg/dL (ref 0.40–1.50)
GFR: 53.07 mL/min — ABNORMAL LOW (ref >60.00)
Glucose, Bld: 120 mg/dL — ABNORMAL HIGH (ref 70–99)
Potassium: 4.3 meq/L (ref 3.5–5.1)
Sodium: 136 meq/L (ref 135–145)
Total Bilirubin: 0.7 mg/dL (ref 0.2–1.2)
Total Protein: 7 g/dL (ref 6.0–8.3)

## 2023-07-24 LAB — CBC WITH DIFFERENTIAL/PLATELET
Basophils Absolute: 0 10*3/uL (ref 0.0–0.1)
Basophils Relative: 0.6 % (ref 0.0–3.0)
Eosinophils Absolute: 0.1 10*3/uL (ref 0.0–0.7)
Eosinophils Relative: 1.2 % (ref 0.0–5.0)
HCT: 45.7 % (ref 39.0–52.0)
Hemoglobin: 15 g/dL (ref 13.0–17.0)
Lymphocytes Relative: 23.5 % (ref 12.0–46.0)
Lymphs Abs: 1.2 10*3/uL (ref 0.7–4.0)
MCHC: 32.8 g/dL (ref 30.0–36.0)
MCV: 83.2 fl (ref 78.0–100.0)
Monocytes Absolute: 0.4 10*3/uL (ref 0.1–1.0)
Monocytes Relative: 8.8 % (ref 3.0–12.0)
Neutro Abs: 3.3 10*3/uL (ref 1.4–7.7)
Neutrophils Relative %: 65.9 % (ref 43.0–77.0)
Platelets: 242 10*3/uL (ref 150.0–400.0)
RBC: 5.49 Mil/uL (ref 4.22–5.81)
RDW: 15.4 % (ref 11.5–15.5)
WBC: 4.9 10*3/uL (ref 4.0–10.5)

## 2023-07-24 LAB — LIPID PANEL
Cholesterol: 136 mg/dL (ref 0–200)
HDL: 55.7 mg/dL (ref >39.00)
LDL Cholesterol: 58 mg/dL (ref 0–99)
NonHDL: 80.12
Total CHOL/HDL Ratio: 2
Triglycerides: 113 mg/dL (ref 0.0–149.0)
VLDL: 22.6 mg/dL (ref 0.0–40.0)

## 2023-07-24 LAB — HEMOGLOBIN A1C: Hgb A1c MFr Bld: 6.3 % (ref 4.6–6.5)

## 2023-07-25 ENCOUNTER — Ambulatory Visit: Payer: Self-pay | Admitting: Family Medicine

## 2023-09-01 ENCOUNTER — Other Ambulatory Visit: Payer: Self-pay | Admitting: Gastroenterology

## 2023-09-16 ENCOUNTER — Encounter: Payer: Self-pay | Admitting: Cardiovascular Disease

## 2023-09-17 ENCOUNTER — Other Ambulatory Visit: Payer: Self-pay | Admitting: Cardiovascular Disease

## 2023-09-17 DIAGNOSIS — I10 Essential (primary) hypertension: Secondary | ICD-10-CM

## 2023-09-17 NOTE — Telephone Encounter (Signed)
 Correct - I didn't stop it. He should go back on Repatha  and fine to write Rx for him. Thank you

## 2023-09-18 ENCOUNTER — Telehealth: Payer: Self-pay

## 2023-09-18 NOTE — Telephone Encounter (Signed)
 Yes grant is active till January of 2026.

## 2023-09-22 ENCOUNTER — Other Ambulatory Visit: Payer: Self-pay | Admitting: Cardiovascular Disease

## 2023-09-22 DIAGNOSIS — E7800 Pure hypercholesterolemia, unspecified: Secondary | ICD-10-CM

## 2023-10-27 ENCOUNTER — Encounter: Payer: Self-pay | Admitting: Cardiovascular Disease

## 2023-10-28 ENCOUNTER — Other Ambulatory Visit: Payer: Self-pay

## 2023-10-28 MED ORDER — VALSARTAN 320 MG PO TABS: 320.0000 mg | ORAL_TABLET | Freq: Every day | ORAL | 1 refills | Status: AC

## 2023-10-28 NOTE — Telephone Encounter (Signed)
 RX sent in

## 2023-11-28 ENCOUNTER — Other Ambulatory Visit: Payer: Self-pay | Admitting: Gastroenterology

## 2024-01-23 ENCOUNTER — Encounter: Payer: Self-pay | Admitting: Family Medicine

## 2024-01-23 ENCOUNTER — Ambulatory Visit: Payer: Medicare (Managed Care) | Admitting: Family Medicine

## 2024-01-23 VITALS — BP 180/80 | HR 83 | Temp 98.0°F | Ht 68.75 in | Wt 180.6 lb

## 2024-01-23 DIAGNOSIS — I10 Essential (primary) hypertension: Secondary | ICD-10-CM

## 2024-01-23 DIAGNOSIS — R7303 Prediabetes: Secondary | ICD-10-CM | POA: Diagnosis not present

## 2024-01-23 DIAGNOSIS — N2889 Other specified disorders of kidney and ureter: Secondary | ICD-10-CM

## 2024-01-23 DIAGNOSIS — I129 Hypertensive chronic kidney disease with stage 1 through stage 4 chronic kidney disease, or unspecified chronic kidney disease: Secondary | ICD-10-CM

## 2024-01-23 DIAGNOSIS — N1831 Chronic kidney disease, stage 3a: Secondary | ICD-10-CM

## 2024-01-23 DIAGNOSIS — E7800 Pure hypercholesterolemia, unspecified: Secondary | ICD-10-CM | POA: Diagnosis not present

## 2024-01-23 LAB — LIPID PANEL
Cholesterol: 124 mg/dL (ref 0–200)
HDL: 48.4 mg/dL (ref >39.00)
LDL Cholesterol: 51 mg/dL (ref 0–99)
NonHDL: 75.88
Total CHOL/HDL Ratio: 3
Triglycerides: 124 mg/dL (ref 0.0–149.0)
VLDL: 24.8 mg/dL (ref 0.0–40.0)

## 2024-01-23 LAB — COMPREHENSIVE METABOLIC PANEL WITH GFR
ALT: 17 U/L (ref 0–53)
AST: 18 U/L (ref 0–37)
Albumin: 4.5 g/dL (ref 3.5–5.2)
Alkaline Phosphatase: 66 U/L (ref 39–117)
BUN: 15 mg/dL (ref 6–23)
CO2: 29 meq/L (ref 19–32)
Calcium: 10.2 mg/dL (ref 8.4–10.5)
Chloride: 103 meq/L (ref 96–112)
Creatinine, Ser: 1.18 mg/dL (ref 0.40–1.50)
GFR: 59.4 mL/min — ABNORMAL LOW (ref >60.00)
Glucose, Bld: 109 mg/dL — ABNORMAL HIGH (ref 70–99)
Potassium: 4.4 meq/L (ref 3.5–5.1)
Sodium: 139 meq/L (ref 135–145)
Total Bilirubin: 0.6 mg/dL (ref 0.2–1.2)
Total Protein: 6.5 g/dL (ref 6.0–8.3)

## 2024-01-23 LAB — HEMOGLOBIN A1C: Hgb A1c MFr Bld: 6.4 % (ref 4.6–6.5)

## 2024-01-23 NOTE — Progress Notes (Signed)
 OFFICE VISIT  01/23/2024  CC:  Chief Complaint  Patient presents with   Medical Management of Chronic Issues    Pt is fasting    Patient is a 78 y.o. male who presents for 66-month follow-up hypertension, hypercholesterolemia, and prediabetes. A/P as of last visit: #1 hypertension, well-controlled on valsartan  320 mg a day, HCTZ 12.5 mg a day, and amlodipine  5 mg a day. Electrolytes and creatinine monitoring today.   2.  Hypercholesterolemia.  He has been on Repatha  injections for the last 5 months. Monitor lipid panel and hepatic panel today.  Will be sure to make sure Dr. Wonda is aware of the results.   # prediabetes. Monitor hemoglobin A1c and fasting glucose today.  INTERIM HX: Feeling well. Home bp's consistently <130/80.  Diet not good. Walking some for exercise but limited by left hip>left knee pain.   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   History of DVT (deep vein thrombosis) 03/2021   Bilateral posterior tibial, peroneal, and soleal veins   History of pulmonary embolism 03/2021   History of syncope    Hyperlipidemia    Hypertension    Osteoarthritis of left knee     Past Surgical History:  Procedure Laterality Date   APPENDECTOMY  02/20/2012   BREAST SURGERY  1969   Removed fatty tissue   COLONOSCOPY     COLONOSCOPY WITH ESOPHAGOGASTRODUODENOSCOPY (EGD)  04/23/2022   FRACTURE SURGERY  1959   Broken leg    Outpatient Medications Prior to Visit  Medication Sig Dispense Refill   amLODipine  (NORVASC ) 5 MG tablet TAKE 1 TABLET(5 MG) BY MOUTH DAILY 90 tablet 3   Evolocumab  (REPATHA  SURECLICK) 140 MG/ML SOAJ ADMINISTER 1 ML  UNDER THE SKIN EVERY 14 DAYS 6 mL 3   hydrochlorothiazide  (HYDRODIURIL ) 12.5 MG tablet Take 12.5 mg by mouth daily.     hydrocortisone  (ANUSOL -HC) 2.5 % rectal cream Place 1 Application rectally as needed for hemorrhoids or anal itching. 30 g 0   pantoprazole  (PROTONIX ) 40 MG tablet TAKE 1 TABLET(40 MG) BY MOUTH DAILY 90 tablet 1   valsartan  (DIOVAN ) 320 MG tablet Take 1 tablet (320 mg total) by mouth daily. 90 tablet 1   No facility-administered medications prior to visit.    Allergies  Allergen Reactions   Tape Other (See Comments)    Adhesive tape caused blisters    Review of Systems As per HPI  PE:    01/23/2024    8:09 AM 01/23/2024    8:01 AM 07/24/2023    8:42 AM  Vitals with BMI  Height  5' 8.75   Weight  180 lbs 10 oz   BMI  26.87   Systolic 180 179 841  Diastolic 80 95 80  Pulse  83      Physical Exam  Gen: Alert, well appearing.  Patient is oriented to person, place, time, and situation. AFFECT: pleasant, lucid thought and speech. CV: RRR, no m/r/g.   LUNGS: CTA bilat, nonlabored resps, good aeration in all lung fields.  LABS:  Last CBC Lab Results  Component Value Date   WBC 4.9 07/24/2023  HGB 15.0 07/24/2023   HCT 45.7 07/24/2023   MCV 83.2 07/24/2023   MCH 26.9 07/18/2022   RDW 15.4 07/24/2023   PLT 242.0 07/24/2023   Last metabolic panel Lab Results  Component Value Date   GLUCOSE 120 (H) 07/24/2023   NA 136 07/24/2023   K 4.3 07/24/2023   CL 100 07/24/2023   CO2 27 07/24/2023   BUN 18 07/24/2023   CREATININE 1.30 07/24/2023   GFR 53.07 (L) 07/24/2023   CALCIUM  10.4 07/24/2023   PHOS 4.0 04/15/2021   PROT 7.0 07/24/2023   ALBUMIN 4.7 07/24/2023   LABGLOB 2.4 07/18/2022   AGRATIO 1.8 07/18/2022   BILITOT 0.7 07/24/2023   ALKPHOS 63 07/24/2023   AST 17 07/24/2023   ALT 17 07/24/2023   ANIONGAP 12 04/15/2021   Last lipids Lab Results  Component Value Date   CHOL 136 07/24/2023   HDL 55.70 07/24/2023   LDLCALC 58 07/24/2023    TRIG 113.0 07/24/2023   CHOLHDL 2 07/24/2023   Last hemoglobin A1c Lab Results  Component Value Date   HGBA1C 6.3 07/24/2023   IMPRESSION AND PLAN:  #1 hypertension, well-controlled per home blood pressure measurements on valsartan  320 mg a day, HCTZ 12.5 mg a day, and amlodipine  5 mg a day. Electrolytes and creatinine monitoring today.   2.  Hypercholesterolemia.  He has been on Repatha  injections for the last one year. Monitor lipid panel and hepatic panel today.  Will be sure to make sure Dr. Wonda is aware of the results.   # prediabetes. Monitor hemoglobin A1c and fasting glucose today.  An After Visit Summary was printed and given to the patient.  FOLLOW UP: Return in about 6 months (around 07/23/2024) for annual CPE (fasting).  Signed:  Gerlene Hockey, MD           01/23/2024

## 2024-01-24 ENCOUNTER — Ambulatory Visit: Payer: Self-pay | Admitting: Family Medicine

## 2024-02-27 ENCOUNTER — Encounter: Payer: Self-pay | Admitting: Cardiovascular Disease

## 2024-05-01 ENCOUNTER — Ambulatory Visit: Payer: Medicare (Managed Care) | Admitting: Emergency Medicine

## 2024-07-27 ENCOUNTER — Encounter: Payer: Medicare (Managed Care) | Admitting: Family Medicine
